# Patient Record
Sex: Female | Born: 1990
Health system: Southern US, Community
[De-identification: ages and names within clinical notes are randomized; demographics above are authoritative.]

## PROBLEM LIST (undated history)

## (undated) DIAGNOSIS — G510 Bell's palsy: Secondary | ICD-10-CM

---
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
## 2012-08-08 LAB — OB RESULTS CONSOLE HIV ANTIBODY (ROUTINE TESTING)
HIV: NONREACTIVE
HIV: NONREACTIVE

## 2012-08-08 LAB — OB RESULTS CONSOLE HEPATITIS B SURFACE ANTIGEN
Hepatitis B Surface Ag: NEGATIVE
Hepatitis B Surface Ag: NEGATIVE

## 2012-08-08 LAB — OB RESULTS CONSOLE GC/CHLAMYDIA: Chlamydia: NEGATIVE

## 2012-08-08 LAB — OB RESULTS CONSOLE RPR: RPR: NONREACTIVE

## 2012-08-08 LAB — OB RESULTS CONSOLE ABO/RH

## 2012-11-04 ENCOUNTER — Encounter (HOSPITAL_COMMUNITY): Payer: Self-pay | Admitting: Emergency Medicine

## 2012-11-04 ENCOUNTER — Emergency Department (HOSPITAL_COMMUNITY)
Admission: EM | Admit: 2012-11-04 | Discharge: 2012-11-04 | Disposition: A | Discharge status: Home / Self Care | Payer: Medicaid Other | Attending: Emergency Medicine | Admitting: Emergency Medicine

## 2012-11-04 ENCOUNTER — Emergency Department: Payer: Self-pay | Admitting: Emergency Medicine

## 2012-11-04 DIAGNOSIS — R197 Diarrhea, unspecified: Secondary | ICD-10-CM | POA: Insufficient documentation

## 2012-11-04 DIAGNOSIS — O219 Vomiting of pregnancy, unspecified: Secondary | ICD-10-CM | POA: Insufficient documentation

## 2012-11-04 DIAGNOSIS — R110 Nausea: Secondary | ICD-10-CM | POA: Insufficient documentation

## 2012-11-04 LAB — URINALYSIS, COMPLETE
Bilirubin,UR: NEGATIVE
Glucose,UR: NEGATIVE mg/dL (ref 0–75)
Nitrite: NEGATIVE
Ph: 6 (ref 4.5–8.0)
Protein: NEGATIVE
RBC,UR: NONE SEEN /HPF (ref 0–5)
Specific Gravity: 1.02 (ref 1.003–1.030)
WBC UR: 1 /HPF (ref 0–5)

## 2012-11-04 LAB — CBC WITH DIFFERENTIAL/PLATELET
Basophils Absolute: 0 10*3/uL (ref 0.0–0.1)
Eosinophils Relative: 0 % (ref 0–5)
HCT: 36.4 % (ref 36.0–46.0)
Lymphocytes Relative: 15 % (ref 12–46)
Lymphs Abs: 0.8 10*3/uL (ref 0.7–4.0)
MCV: 84.3 fL (ref 78.0–100.0)
Monocytes Absolute: 0.5 10*3/uL (ref 0.1–1.0)
Neutro Abs: 4.1 10*3/uL (ref 1.7–7.7)
RBC: 4.32 MIL/uL (ref 3.87–5.11)
RDW: 13.1 % (ref 11.5–15.5)
WBC: 5.3 10*3/uL (ref 4.0–10.5)

## 2012-11-04 LAB — COMPREHENSIVE METABOLIC PANEL
ALT: 11 U/L (ref 0–35)
AST: 18 U/L (ref 0–37)
BUN: 5 mg/dL — ABNORMAL LOW (ref 7–18)
Bilirubin,Total: 0.5 mg/dL (ref 0.2–1.0)
CO2: 19 mEq/L (ref 19–32)
Chloride: 99 mEq/L (ref 96–112)
Co2: 22 mmol/L (ref 21–32)
Creatinine, Ser: 0.44 mg/dL — ABNORMAL LOW (ref 0.50–1.10)
EGFR (African American): >60
EGFR (Non-African Amer.): >60
GFR calc Af Amer: >90 mL/min (ref >90)
GFR calc non Af Amer: >90 mL/min (ref >90)
Glucose, Bld: 79 mg/dL (ref 70–99)
Osmolality: 264 (ref 275–301)
Potassium: 3.3 mmol/L — ABNORMAL LOW (ref 3.5–5.1)
SGPT (ALT): 17 U/L (ref 12–78)
Sodium: 133 mEq/L — ABNORMAL LOW (ref 135–145)
Total Bilirubin: 0.4 mg/dL (ref 0.3–1.2)
Total Protein: 7.7 g/dL (ref 6.4–8.2)

## 2012-11-04 LAB — CBC
HCT: 38.4 % (ref 35.0–47.0)
MCHC: 34.7 g/dL (ref 32.0–36.0)
Platelet: 220 10*3/uL (ref 150–440)
WBC: 5.8 10*3/uL (ref 3.6–11.0)

## 2012-11-04 LAB — URINALYSIS, ROUTINE W REFLEX MICROSCOPIC
Glucose, UA: NEGATIVE mg/dL
Hgb urine dipstick: NEGATIVE
Ketones, ur: >80 mg/dL — AB
Protein, ur: 30 mg/dL — AB
Urobilinogen, UA: 1 mg/dL (ref 0.0–1.0)

## 2012-11-04 LAB — HCG, QUANTITATIVE, PREGNANCY: Beta Hcg, Quant.: 10459 m[IU]/mL — ABNORMAL HIGH

## 2012-11-04 LAB — LIPASE, BLOOD: Lipase: 70 U/L — ABNORMAL LOW (ref 73–393)

## 2012-11-04 MED ORDER — SODIUM CHLORIDE 0.9 % IV BOLUS (SEPSIS): 1000.0000 mL | Freq: Once | INTRAVENOUS | Status: DC

## 2012-11-04 MED ORDER — SODIUM CHLORIDE 0.9 % IV BOLUS (SEPSIS)
1000.0000 mL | Freq: Once | INTRAVENOUS | Status: AC
  Administered 2012-11-04: 1000 mL via INTRAVENOUS

## 2012-11-04 MED ORDER — POTASSIUM CHLORIDE 10 MEQ/100ML IV SOLN
10.0000 meq | Freq: Once | INTRAVENOUS | Status: AC
  Administered 2012-11-04: 10 meq via INTRAVENOUS
  Filled 2012-11-04: qty 100

## 2012-11-04 MED ORDER — POTASSIUM CHLORIDE CRYS ER 20 MEQ PO TBCR
40.0000 meq | EXTENDED_RELEASE_TABLET | Freq: Once | ORAL | Status: AC
  Administered 2012-11-04: 40 meq via ORAL
  Filled 2012-11-04: qty 2

## 2012-11-04 MED ORDER — ONDANSETRON HCL 4 MG PO TABS: 4.0000 mg | ORAL_TABLET | Freq: Four times a day (QID) | ORAL | Status: DC

## 2012-11-04 MED ORDER — ONDANSETRON HCL 4 MG/2ML IJ SOLN
4.0000 mg | Freq: Once | INTRAMUSCULAR | Status: AC
  Administered 2012-11-04: 4 mg via INTRAVENOUS
  Filled 2012-11-04: qty 2

## 2012-11-04 NOTE — ED Notes (Addendum)
Pt presenting to ed with c/o "I'm 5 months pregnant and I'm having abdominal pain with positive nausea, vomiting and diarrhea x 1 day. Pt states I haven't been able to keep anything down so I wanted to make sure that everything was ok. Pt states I'm having abdominal pressure.

## 2012-11-04 NOTE — ED Provider Notes (Signed)
History  This chart was scribed for Glynn Octave, MD by Bennett Scrape, ED Scribe. This patient was seen in room WA12/WA12 and the patient's care was started at 7:01 PM.  CSN: [id number]  Arrival date & time 11/04/12  1732   First MD Initiated Contact with Patient 11/04/12 1901      Chief Complaint  Patient presents with  . pregnant 5 months   . nausea and vomiting    The history is provided by the patient. No language interpreter was used.    Cynthia Patel is a 22 y.o. female who is 5 months pregnant who presents to the Emergency Department complaining of sudden onset, non-changing, constant non-bloody emesis with associated abdominal pressure and nausea that started last night. She reports that she call her PCP at the health department but the health department was closed. 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 She denies eating any suspect foods. She is G1P0. She reports that she has had a [place] that showed the pregnancy was in the correct postion and that the baby is growing properly. She denies having any nausea with this pregnancy until now. She reports having a normal BM today. She reports a HA earlier but denies one now. She denies vaginal bleeding or discharge, fevers, diarrhea, consitpation, abdominal pain, CP and back pain as associated symptoms. She does not have a h/o chronic medical conditions and denies smoking and alcohol use.  PCP is with [facility] and Ob[facility]GYN is with [facility].  History reviewed. No pertinent past medical history.  History reviewed. No pertinent past surgical history.  No family history on file.  History  Substance Use Topics  . Smoking status: Never Smoker   . Smokeless tobacco: Not on file  . Alcohol Use: No    OB History    Grav Para Term Preterm Abortions TAB SAB Ect Mult Living   1               Review of Systems  A complete 10  system review of systems was obtained and all systems are negative except as noted in the HPI and PMH.   Allergies  Review of patient's allergies indicates no known allergies.  Home Medications   Current Outpatient Rx  Name  Route  Sig  Dispense  Refill  . PRENATAL MULTIVITAMIN CH   Oral   Take 1 tablet by mouth daily.           Triage Vitals: BP 126/63  Pulse 114  Temp 100 F (37.8 C) (Oral)  Resp 20  SpO2 100%  LMP 05/15/2012  Physical Exam  Nursing note and vitals reviewed. Constitutional: She is oriented to person, place, and time. She appears well-developed and well-nourished. No distress.  HENT:  Head: Normocephalic and atraumatic.  Mouth/Throat: Oropharynx is clear and moist.       Moist MM  Eyes: Conjunctivae normal and EOM are normal. Pupils are equal, round, and reactive to light.  Neck: Normal range of motion. Neck supple. No tracheal deviation present.  Cardiovascular: Normal rate and regular rhythm.   Pulmonary/Chest: Effort normal and breath sounds normal. No respiratory distress.  Abdominal: Soft. There is no tenderness.       Gravid abdomen, no CVA tenderness  Musculoskeletal: Normal range of motion.  Neurological: She is alert and oriented to person, place, and time.  Skin: Skin is warm and dry.  Psychiatric: She  has a normal mood and affect. Her behavior is normal.    ED Course  Procedures (including critical care time)  DIAGNOSTIC STUDIES: Oxygen Saturation is 100% on room air, normal by my interpretation.    COORDINATION OF CARE: 7:10 PM-Discussed treatment plan which includes antiemetic, IV fluids, CXR, CBC panel, and UA with pt at bedside and pt agreed to plan.   7:15 PM- Ordered 1,000 mL of bolus and 4 mg Zofran injection  8:43 PM- Pt rechecked and states that she feels improved and is willing to try a PO challenge. Informed pt of lab work results and of what lab work is still pending.  10:25 PM- Pt rechecked and reports improvement in  symptoms. She denies nausea currently and reports that she drank water with no complications. Discussed discharge plan and pt agreed.  Labs Reviewed  COMPREHENSIVE METABOLIC PANEL - Abnormal; Notable for the following:    Sodium 133 (*)     Potassium 3.1 (*)     BUN 5 (*)     Creatinine, Ser 0.44 (*)     Albumin 3.1 (*)     All other components within normal limits  URINALYSIS, ROUTINE W REFLEX MICROSCOPIC - Abnormal; Notable for the following:    Color, Urine AMBER (*)  BIOCHEMICALS MAY BE AFFECTED BY COLOR   Specific Gravity, Urine 1.033 (*)     Bilirubin Urine SMALL (*)     Ketones, ur >80 (*)     Protein, ur 30 (*)     All other components within normal limits  PREGNANCY, URINE - Abnormal; Notable for the following:    Preg Test, Ur POSITIVE (*)     All other components within normal limits  URINE MICROSCOPIC-ADD ON - Abnormal; Notable for the following:    Casts GRANULAR CAST (*)     All other components within normal limits  CBC WITH DIFFERENTIAL  LIPASE, BLOOD   No results found.   No diagnosis found.    MDM  Patient is 5 months pregnant confirmed by OB in [place] presenting with nausea and vomiting for the past day. She denies abdominal pain, vaginal bleeding, contractions, chest pain or shortness of breath.  Patient's abdomen is gravid, soft and nontender. She is given IV fluids, antiemetics. She's had no vomiting in the ED. Pregnancy was monitored a rapid response nurse. No contractions or fetal distress noted.  Patient feels improved and is tolerating by mouth. Her heart rate is improved and she is stable for followup with her OB. She'll be given Zofran when necessary  I personally performed the services described in this documentation, which was scribed in my presence. The recorded information has been reviewed and is accurate.      Glynn Octave, MD 11/04/12 [id number]

## 2012-11-22 LAB — HM HIV SCREENING LAB: HM HIV Screening: NEGATIVE

## 2012-12-02 LAB — OB RESULTS CONSOLE RPR: RPR: NONREACTIVE

## 2013-01-29 LAB — OB RESULTS CONSOLE GBS
GBS: POSITIVE
GBS: POSITIVE

## 2013-02-21 ENCOUNTER — Observation Stay: Payer: Self-pay | Admitting: Obstetrics & Gynecology

## 2013-02-22 ENCOUNTER — Inpatient Hospital Stay (HOSPITAL_COMMUNITY)
Admission: AD | Admit: 2013-02-22 | Discharge: 2013-02-26 | DRG: 765 | Disposition: A | Discharge status: Home / Self Care | Payer: Medicaid Other | Source: Ambulatory Visit | Attending: Obstetrics & Gynecology | Admitting: Obstetrics & Gynecology

## 2013-02-22 ENCOUNTER — Encounter (HOSPITAL_COMMUNITY): Payer: Self-pay

## 2013-02-22 ENCOUNTER — Observation Stay: Payer: Self-pay | Admitting: Obstetrics & Gynecology

## 2013-02-22 DIAGNOSIS — O1002 Pre-existing essential hypertension complicating childbirth: Secondary | ICD-10-CM | POA: Diagnosis present

## 2013-02-22 DIAGNOSIS — Z22330 Carrier of Group B streptococcus: Secondary | ICD-10-CM

## 2013-02-22 DIAGNOSIS — O324XX0 Maternal care for high head at term, not applicable or unspecified: Secondary | ICD-10-CM | POA: Diagnosis present

## 2013-02-22 DIAGNOSIS — O99892 Other specified diseases and conditions complicating childbirth: Secondary | ICD-10-CM | POA: Diagnosis present

## 2013-02-22 NOTE — MAU Note (Signed)
Contractions all day have not gotten any rest.

## 2013-02-22 NOTE — MAU Provider Note (Signed)
  History     CSN: [id number]  Arrival date and time: 02/22/13 2117   None     Chief Complaint  Patient presents with  . Contractions   HPI Cynthia Patel is a 22 y.o. G1P0 female at [redacted]w[redacted]d who presents w/ report of uc's all day that have increased in frequency and intensity.  Reports good fm.  Denies lof or vb. Went to [employer] earlier today for same and was 1cm and d/c'd. PNC at [facility]. Came here b/c she didn't think she could make it to [place].  Denies any complications during pregnancy other than few elevated bp's 1st trimester and 3rd trimester. Per pt has never been dx w/ CHTN or worked up for pre-e. Denies ha, scotomata, ruq/epigastric pain, n/v.     OB History   Grav Para Term Preterm Abortions TAB SAB Ect Mult Living   1               No past medical history on file.  No past surgical history on file.  No family history on file.  History  Substance Use Topics  . Smoking status: Never Smoker   . Smokeless tobacco: Not on file  . Alcohol Use: No    Allergies:  Allergies  Allergen Reactions  . Amoxicillin Itching and Rash    Prescriptions prior to admission  Medication Sig Dispense Refill  . acetaminophen (TYLENOL) 500 MG tablet Take 500 mg by mouth every 6 (six) hours as needed for pain.      . Prenatal Vit-Fe Fumarate-FA (PRENATAL MULTIVITAMIN) TABS Take 1 tablet by mouth daily.        Review of Systems  Constitutional: Negative.   HENT: Negative.   Eyes: Negative.  Negative for blurred vision and double vision.  Respiratory: Negative.   Cardiovascular: Negative.   Gastrointestinal: Positive for abdominal pain (uc's).  Genitourinary: Negative.   Musculoskeletal: Negative.   Skin: Negative.   Neurological: Negative.  Negative for headaches.  Endo/Heme/Allergies: Negative.   Psychiatric/Behavioral: Negative.    Physical Exam   Blood pressure 140/89, pulse 95, temperature 98.1 F (36.7 C), resp. rate 18, height 5\' 6"  (1.676 m), last  menstrual period 05/15/2012.  Physical Exam  Constitutional: She is oriented to person, place, and time. She appears well-developed and well-nourished.  HENT:  Head: Normocephalic.  Neck: Normal range of motion.  Cardiovascular: Normal rate.   Respiratory: Effort normal.  GI: Soft.  gravid  Genitourinary: Vagina normal and uterus normal.  4/80/-2, vtx  Musculoskeletal: Normal range of motion.  Neurological: She is alert and oriented to person, place, and time. She has normal reflexes.  Skin: Skin is warm and dry.  Psychiatric: She has a normal mood and affect. Her behavior is normal. Judgment and thought content normal.   2240: Pt requests to walk, will recheck cx in 1hr  MAU Course  Procedures  EFM SVE  Assessment and Plan  Please refer to H&P  [name] 02/22/2013, 10:42 PM

## 2013-02-23 ENCOUNTER — Encounter (HOSPITAL_COMMUNITY): Payer: Self-pay | Admitting: Anesthesiology

## 2013-02-23 ENCOUNTER — Encounter (HOSPITAL_COMMUNITY)
Admission: AD | Disposition: A | Discharge status: Home / Self Care | Payer: Self-pay | Source: Ambulatory Visit | Attending: Obstetrics & Gynecology

## 2013-02-23 ENCOUNTER — Encounter (HOSPITAL_COMMUNITY): Payer: Self-pay | Admitting: *Deleted

## 2013-02-23 ENCOUNTER — Inpatient Hospital Stay (HOSPITAL_COMMUNITY): Payer: Medicaid Other | Admitting: Anesthesiology

## 2013-02-23 DIAGNOSIS — O1002 Pre-existing essential hypertension complicating childbirth: Secondary | ICD-10-CM

## 2013-02-23 DIAGNOSIS — O324XX0 Maternal care for high head at term, not applicable or unspecified: Secondary | ICD-10-CM

## 2013-02-23 LAB — TYPE AND SCREEN
ABO/RH(D): O POS
Antibody Screen: NEGATIVE

## 2013-02-23 LAB — PROTEIN / CREATININE RATIO, URINE
Protein Creatinine Ratio: 0.25 — ABNORMAL HIGH (ref 0.00–0.15)
Total Protein, Urine: 62.2 mg/dL

## 2013-02-23 LAB — CBC
Hemoglobin: 11.7 g/dL — ABNORMAL LOW (ref 12.0–15.0)
MCH: 26.5 pg (ref 26.0–34.0)
Platelets: 260 10*3/uL (ref 150–400)
RBC: 4.42 MIL/uL (ref 3.87–5.11)

## 2013-02-23 LAB — RPR: RPR Ser Ql: NONREACTIVE

## 2013-02-23 LAB — COMPREHENSIVE METABOLIC PANEL
AST: 14 U/L (ref 0–37)
CO2: 20 mEq/L (ref 19–32)
Calcium: 9.2 mg/dL (ref 8.4–10.5)
Creatinine, Ser: 0.46 mg/dL — ABNORMAL LOW (ref 0.50–1.10)
GFR calc Af Amer: >90 mL/min (ref >90)
GFR calc non Af Amer: >90 mL/min (ref >90)
Glucose, Bld: 96 mg/dL (ref 70–99)
Total Protein: 6.8 g/dL (ref 6.0–8.3)

## 2013-02-23 SURGERY — Surgical Case
Anesthesia: Epidural | Site: Abdomen | Wound class: Clean Contaminated

## 2013-02-23 MED ORDER — OXYTOCIN 40 UNITS IN LACTATED RINGERS INFUSION - SIMPLE MED: 62.5000 mL/h | INTRAVENOUS | Status: AC

## 2013-02-23 MED ORDER — OXYTOCIN 10 UNIT/ML IJ SOLN
INTRAMUSCULAR | Status: AC
  Filled 2013-02-23: qty 2

## 2013-02-23 MED ORDER — FENTANYL CITRATE 0.05 MG/ML IJ SOLN
100.0000 ug | INTRAMUSCULAR | Status: DC | PRN
  Administered 2013-02-23: 100 ug via INTRAVENOUS
  Filled 2013-02-23: qty 2

## 2013-02-23 MED ORDER — METOCLOPRAMIDE HCL 5 MG/ML IJ SOLN: 10.0000 mg | Freq: Three times a day (TID) | INTRAMUSCULAR | Status: DC | PRN

## 2013-02-23 MED ORDER — KETOROLAC TROMETHAMINE 30 MG/ML IJ SOLN
INTRAMUSCULAR | Status: AC
  Filled 2013-02-23: qty 1

## 2013-02-23 MED ORDER — LANOLIN HYDROUS EX OINT: 1.0000 "application " | TOPICAL_OINTMENT | CUTANEOUS | Status: DC | PRN

## 2013-02-23 MED ORDER — OXYTOCIN BOLUS FROM INFUSION: 500.0000 mL | INTRAVENOUS | Status: DC

## 2013-02-23 MED ORDER — ONDANSETRON HCL 4 MG/2ML IJ SOLN: 4.0000 mg | Freq: Three times a day (TID) | INTRAMUSCULAR | Status: DC | PRN

## 2013-02-23 MED ORDER — ONDANSETRON HCL 4 MG/2ML IJ SOLN: 4.0000 mg | Freq: Four times a day (QID) | INTRAMUSCULAR | Status: DC | PRN

## 2013-02-23 MED ORDER — LACTATED RINGERS IV SOLN
INTRAVENOUS | Status: DC
  Administered 2013-02-23 (×4): via INTRAVENOUS

## 2013-02-23 MED ORDER — FENTANYL CITRATE 0.05 MG/ML IJ SOLN
INTRAMUSCULAR | Status: AC
  Filled 2013-02-23: qty 2

## 2013-02-23 MED ORDER — FENTANYL CITRATE 0.05 MG/ML IJ SOLN
25.0000 ug | INTRAMUSCULAR | Status: DC | PRN
  Administered 2013-02-23: 50 ug via INTRAVENOUS

## 2013-02-23 MED ORDER — IBUPROFEN 600 MG PO TABS
600.0000 mg | ORAL_TABLET | Freq: Four times a day (QID) | ORAL | Status: DC
  Administered 2013-02-24 – 2013-02-26 (×11): 600 mg via ORAL
  Filled 2013-02-23 (×12): qty 1

## 2013-02-23 MED ORDER — OXYTOCIN 40 UNITS IN LACTATED RINGERS INFUSION - SIMPLE MED
1.0000 m[IU]/min | INTRAVENOUS | Status: DC
  Administered 2013-02-23: 2 m[IU]/min via INTRAVENOUS

## 2013-02-23 MED ORDER — MENTHOL 3 MG MT LOZG: 1.0000 | LOZENGE | OROMUCOSAL | Status: DC | PRN

## 2013-02-23 MED ORDER — PHENYLEPHRINE 40 MCG/ML (10ML) SYRINGE FOR IV PUSH (FOR BLOOD PRESSURE SUPPORT)
80.0000 ug | PREFILLED_SYRINGE | INTRAVENOUS | Status: DC | PRN
  Filled 2013-02-23: qty 5

## 2013-02-23 MED ORDER — DIPHENHYDRAMINE HCL 50 MG/ML IJ SOLN: 12.5000 mg | INTRAMUSCULAR | Status: DC | PRN

## 2013-02-23 MED ORDER — ONDANSETRON HCL 4 MG/2ML IJ SOLN
INTRAMUSCULAR | Status: AC
  Filled 2013-02-23: qty 2

## 2013-02-23 MED ORDER — KETOROLAC TROMETHAMINE 30 MG/ML IJ SOLN: 30.0000 mg | Freq: Four times a day (QID) | INTRAMUSCULAR | Status: AC | PRN

## 2013-02-23 MED ORDER — ACETAMINOPHEN 325 MG PO TABS: 650.0000 mg | ORAL_TABLET | ORAL | Status: DC | PRN

## 2013-02-23 MED ORDER — CLINDAMYCIN PHOSPHATE 900 MG/50ML IV SOLN
900.0000 mg | Freq: Three times a day (TID) | INTRAVENOUS | Status: DC
  Administered 2013-02-23 (×2): 900 mg via INTRAVENOUS
  Filled 2013-02-23 (×4): qty 50

## 2013-02-23 MED ORDER — DIPHENHYDRAMINE HCL 25 MG PO CAPS
25.0000 mg | ORAL_CAPSULE | ORAL | Status: DC | PRN
  Filled 2013-02-23: qty 1

## 2013-02-23 MED ORDER — SCOPOLAMINE 1 MG/3DAYS TD PT72
1.0000 | MEDICATED_PATCH | Freq: Once | TRANSDERMAL | Status: DC
  Administered 2013-02-23: 1.5 mg via TRANSDERMAL

## 2013-02-23 MED ORDER — LACTATED RINGERS IV SOLN
INTRAVENOUS | Status: DC
  Administered 2013-02-23: 22:00:00 via INTRAVENOUS

## 2013-02-23 MED ORDER — NALOXONE HCL 0.4 MG/ML IJ SOLN: 0.4000 mg | INTRAMUSCULAR | Status: DC | PRN

## 2013-02-23 MED ORDER — WITCH HAZEL-GLYCERIN EX PADS: 1.0000 "application " | MEDICATED_PAD | CUTANEOUS | Status: DC | PRN

## 2013-02-23 MED ORDER — SENNOSIDES-DOCUSATE SODIUM 8.6-50 MG PO TABS
2.0000 | ORAL_TABLET | Freq: Every day | ORAL | Status: DC
  Administered 2013-02-24 – 2013-02-25 (×2): 2 via ORAL

## 2013-02-23 MED ORDER — MEASLES, MUMPS & RUBELLA VAC ~~LOC~~ INJ
0.5000 mL | INJECTION | Freq: Once | SUBCUTANEOUS | Status: DC
  Filled 2013-02-23: qty 0.5

## 2013-02-23 MED ORDER — MORPHINE SULFATE (PF) 0.5 MG/ML IJ SOLN
INTRAMUSCULAR | Status: DC | PRN
  Administered 2013-02-23: 4 mg via EPIDURAL

## 2013-02-23 MED ORDER — KETOROLAC TROMETHAMINE 30 MG/ML IJ SOLN
30.0000 mg | Freq: Four times a day (QID) | INTRAMUSCULAR | Status: AC | PRN
  Administered 2013-02-23: 30 mg via INTRAVENOUS

## 2013-02-23 MED ORDER — FERROUS SULFATE 325 (65 FE) MG PO TABS
325.0000 mg | ORAL_TABLET | Freq: Two times a day (BID) | ORAL | Status: DC
  Administered 2013-02-24 – 2013-02-26 (×5): 325 mg via ORAL
  Filled 2013-02-23 (×5): qty 1

## 2013-02-23 MED ORDER — ONDANSETRON HCL 4 MG/2ML IJ SOLN
INTRAMUSCULAR | Status: DC | PRN
  Administered 2013-02-23: 4 mg via INTRAVENOUS

## 2013-02-23 MED ORDER — BISACODYL 10 MG RE SUPP: 10.0000 mg | Freq: Every day | RECTAL | Status: DC | PRN

## 2013-02-23 MED ORDER — OXYTOCIN 10 UNIT/ML IJ SOLN
40.0000 [IU] | INTRAVENOUS | Status: DC | PRN
  Administered 2013-02-23: 40 [IU] via INTRAVENOUS

## 2013-02-23 MED ORDER — OXYCODONE-ACETAMINOPHEN 5-325 MG PO TABS
1.0000 | ORAL_TABLET | ORAL | Status: DC | PRN
  Administered 2013-02-24 – 2013-02-26 (×3): 1 via ORAL
  Filled 2013-02-23 (×3): qty 1

## 2013-02-23 MED ORDER — IBUPROFEN 600 MG PO TABS: 600.0000 mg | ORAL_TABLET | Freq: Four times a day (QID) | ORAL | Status: DC | PRN

## 2013-02-23 MED ORDER — SIMETHICONE 80 MG PO CHEW
80.0000 mg | CHEWABLE_TABLET | Freq: Three times a day (TID) | ORAL | Status: DC
  Administered 2013-02-23 – 2013-02-26 (×9): 80 mg via ORAL

## 2013-02-23 MED ORDER — DIPHENHYDRAMINE HCL 25 MG PO CAPS
25.0000 mg | ORAL_CAPSULE | Freq: Four times a day (QID) | ORAL | Status: DC | PRN
  Administered 2013-02-24: 25 mg via ORAL

## 2013-02-23 MED ORDER — SCOPOLAMINE 1 MG/3DAYS TD PT72
MEDICATED_PATCH | TRANSDERMAL | Status: AC
  Filled 2013-02-23: qty 1

## 2013-02-23 MED ORDER — BUPIVACAINE HCL (PF) 0.25 % IJ SOLN
INTRAMUSCULAR | Status: AC
  Filled 2013-02-23: qty 30

## 2013-02-23 MED ORDER — LACTATED RINGERS IV SOLN: 500.0000 mL | INTRAVENOUS | Status: DC | PRN

## 2013-02-23 MED ORDER — OXYCODONE-ACETAMINOPHEN 5-325 MG PO TABS: 1.0000 | ORAL_TABLET | ORAL | Status: DC | PRN

## 2013-02-23 MED ORDER — SODIUM CHLORIDE 0.9 % IR SOLN
Status: DC | PRN
  Administered 2013-02-23: 1000 mL

## 2013-02-23 MED ORDER — MORPHINE SULFATE 0.5 MG/ML IJ SOLN
INTRAMUSCULAR | Status: AC
  Filled 2013-02-23: qty 10

## 2013-02-23 MED ORDER — LIDOCAINE HCL (PF) 1 % IJ SOLN
30.0000 mL | INTRAMUSCULAR | Status: DC | PRN
  Filled 2013-02-23: qty 30

## 2013-02-23 MED ORDER — MEPERIDINE HCL 25 MG/ML IJ SOLN
6.2500 mg | INTRAMUSCULAR | Status: DC | PRN
  Administered 2013-02-23: 6.25 mg via INTRAVENOUS

## 2013-02-23 MED ORDER — NALBUPHINE HCL 10 MG/ML IJ SOLN
5.0000 mg | INTRAMUSCULAR | Status: DC | PRN
  Filled 2013-02-23: qty 1

## 2013-02-23 MED ORDER — SODIUM BICARBONATE 8.4 % IV SOLN
INTRAVENOUS | Status: AC
  Filled 2013-02-23: qty 50

## 2013-02-23 MED ORDER — LACTATED RINGERS IV SOLN: 500.0000 mL | Freq: Once | INTRAVENOUS | Status: DC

## 2013-02-23 MED ORDER — ZOLPIDEM TARTRATE 5 MG PO TABS: 5.0000 mg | ORAL_TABLET | Freq: Every evening | ORAL | Status: DC | PRN

## 2013-02-23 MED ORDER — CITRIC ACID-SODIUM CITRATE 334-500 MG/5ML PO SOLN
30.0000 mL | ORAL | Status: DC | PRN
  Administered 2013-02-23: 30 mL via ORAL
  Filled 2013-02-23: qty 15

## 2013-02-23 MED ORDER — SODIUM BICARBONATE 8.4 % IV SOLN
INTRAVENOUS | Status: DC | PRN
  Administered 2013-02-23: 5 mL via EPIDURAL

## 2013-02-23 MED ORDER — LIDOCAINE HCL (PF) 1 % IJ SOLN
INTRAMUSCULAR | Status: DC | PRN
  Administered 2013-02-23 (×4): 4 mL

## 2013-02-23 MED ORDER — SIMETHICONE 80 MG PO CHEW: 80.0000 mg | CHEWABLE_TABLET | ORAL | Status: DC | PRN

## 2013-02-23 MED ORDER — DIBUCAINE 1 % RE OINT: 1.0000 "application " | TOPICAL_OINTMENT | RECTAL | Status: DC | PRN

## 2013-02-23 MED ORDER — DIPHENHYDRAMINE HCL 50 MG/ML IJ SOLN: 25.0000 mg | INTRAMUSCULAR | Status: DC | PRN

## 2013-02-23 MED ORDER — ONDANSETRON HCL 4 MG PO TABS: 4.0000 mg | ORAL_TABLET | ORAL | Status: DC | PRN

## 2013-02-23 MED ORDER — SODIUM CHLORIDE 0.9 % IJ SOLN: 3.0000 mL | INTRAMUSCULAR | Status: DC | PRN

## 2013-02-23 MED ORDER — FENTANYL CITRATE 0.05 MG/ML IJ SOLN
INTRAMUSCULAR | Status: DC | PRN
  Administered 2013-02-23: 100 ug via EPIDURAL

## 2013-02-23 MED ORDER — OXYTOCIN 40 UNITS IN LACTATED RINGERS INFUSION - SIMPLE MED
62.5000 mL/h | INTRAVENOUS | Status: DC
  Filled 2013-02-23: qty 1000

## 2013-02-23 MED ORDER — FENTANYL 2.5 MCG/ML BUPIVACAINE 1/10 % EPIDURAL INFUSION (WH - ANES)
14.0000 mL/h | INTRAMUSCULAR | Status: DC | PRN
  Administered 2013-02-23 (×2): 14 mL/h via EPIDURAL
  Filled 2013-02-23 (×2): qty 125

## 2013-02-23 MED ORDER — EPHEDRINE 5 MG/ML INJ
10.0000 mg | INTRAVENOUS | Status: DC | PRN
  Filled 2013-02-23: qty 4

## 2013-02-23 MED ORDER — TERBUTALINE SULFATE 1 MG/ML IJ SOLN: 0.2500 mg | Freq: Once | INTRAMUSCULAR | Status: DC | PRN

## 2013-02-23 MED ORDER — EPHEDRINE 5 MG/ML INJ
10.0000 mg | INTRAVENOUS | Status: DC | PRN
  Administered 2013-02-23: 10 mg via INTRAVENOUS

## 2013-02-23 MED ORDER — FLEET ENEMA 7-19 GM/118ML RE ENEM: 1.0000 | ENEMA | Freq: Every day | RECTAL | Status: DC | PRN

## 2013-02-23 MED ORDER — TETANUS-DIPHTH-ACELL PERTUSSIS 5-2.5-18.5 LF-MCG/0.5 IM SUSP: 0.5000 mL | Freq: Once | INTRAMUSCULAR | Status: DC

## 2013-02-23 MED ORDER — LIDOCAINE-EPINEPHRINE (PF) 2 %-1:200000 IJ SOLN
INTRAMUSCULAR | Status: AC
  Filled 2013-02-23: qty 20

## 2013-02-23 MED ORDER — NALOXONE HCL 1 MG/ML IJ SOLN: 1.0000 ug/kg/h | INTRAVENOUS | Status: DC | PRN

## 2013-02-23 MED ORDER — MEPERIDINE HCL 25 MG/ML IJ SOLN
INTRAMUSCULAR | Status: AC
  Filled 2013-02-23: qty 1

## 2013-02-23 MED ORDER — BUPIVACAINE HCL (PF) 0.25 % IJ SOLN
INTRAMUSCULAR | Status: DC | PRN
  Administered 2013-02-23: 30 mL

## 2013-02-23 MED ORDER — PRENATAL MULTIVITAMIN CH
1.0000 | ORAL_TABLET | Freq: Every day | ORAL | Status: DC
  Administered 2013-02-24 – 2013-02-26 (×3): 1 via ORAL
  Filled 2013-02-23 (×3): qty 1

## 2013-02-23 MED ORDER — ONDANSETRON HCL 4 MG/2ML IJ SOLN: 4.0000 mg | INTRAMUSCULAR | Status: DC | PRN

## 2013-02-23 MED ORDER — PHENYLEPHRINE 40 MCG/ML (10ML) SYRINGE FOR IV PUSH (FOR BLOOD PRESSURE SUPPORT)
80.0000 ug | PREFILLED_SYRINGE | INTRAVENOUS | Status: DC | PRN
  Administered 2013-02-23 (×2): 80 ug via INTRAVENOUS

## 2013-02-23 MED ORDER — FLEET ENEMA 7-19 GM/118ML RE ENEM: 1.0000 | ENEMA | RECTAL | Status: DC | PRN

## 2013-02-23 SURGICAL SUPPLY — 31 items
CLOTH BEACON ORANGE TIMEOUT ST (SAFETY) ×2 IMPLANT
DRAPE LG THREE QUARTER DISP (DRAPES) ×2 IMPLANT
DRSG OPSITE POSTOP 4X10 (GAUZE/BANDAGES/DRESSINGS) ×2 IMPLANT
DURAPREP 26ML APPLICATOR (WOUND CARE) ×2 IMPLANT
ELECT REM PT RETURN 9FT ADLT (ELECTROSURGICAL) ×2
ELECTRODE REM PT RTRN 9FT ADLT (ELECTROSURGICAL) ×1 IMPLANT
EXTRACTOR VACUUM M CUP 4 TUBE (SUCTIONS) IMPLANT
GLOVE BIOGEL PI IND STRL 7.0 (GLOVE) ×2 IMPLANT
GLOVE BIOGEL PI INDICATOR 7.0 (GLOVE) ×2
GLOVE ECLIPSE 7.0 STRL STRAW (GLOVE) ×2 IMPLANT
GOWN PREVENTION PLUS XLARGE (GOWN DISPOSABLE) ×2 IMPLANT
GOWN STRL REIN XL XLG (GOWN DISPOSABLE) ×6 IMPLANT
KIT ABG SYR 3ML LUER SLIP (SYRINGE) ×2 IMPLANT
NEEDLE HYPO 22GX1.5 SAFETY (NEEDLE) ×2 IMPLANT
NEEDLE HYPO 25X5/8 SAFETYGLIDE (NEEDLE) ×2 IMPLANT
NS IRRIG 1000ML POUR BTL (IV SOLUTION) ×2 IMPLANT
PACK C SECTION WH (CUSTOM PROCEDURE TRAY) ×2 IMPLANT
PAD ABD 7.5X8 STRL (GAUZE/BANDAGES/DRESSINGS) ×2 IMPLANT
PAD OB MATERNITY 4.3X12.25 (PERSONAL CARE ITEMS) ×2 IMPLANT
RTRCTR C-SECT PINK 25CM LRG (MISCELLANEOUS) ×2 IMPLANT
STAPLER VISISTAT 35W (STAPLE) IMPLANT
STRIP CLOSURE SKIN 1/2X4 (GAUZE/BANDAGES/DRESSINGS) ×2 IMPLANT
SUT PLAIN 2 0 XLH (SUTURE) ×2 IMPLANT
SUT VIC AB 0 CTX 36 (SUTURE) ×3
SUT VIC AB 0 CTX36XBRD ANBCTRL (SUTURE) ×3 IMPLANT
SUT VIC AB 4-0 KS 27 (SUTURE) ×2 IMPLANT
SYR 30ML LL (SYRINGE) ×2 IMPLANT
TAPE CLOTH SURG 4X10 WHT LF (GAUZE/BANDAGES/DRESSINGS) ×2 IMPLANT
TOWEL OR 17X24 6PK STRL BLUE (TOWEL DISPOSABLE) ×6 IMPLANT
TRAY FOLEY CATH 14FR (SET/KITS/TRAYS/PACK) IMPLANT
WATER STERILE IRR 1000ML POUR (IV SOLUTION) ×2 IMPLANT

## 2013-02-23 NOTE — H&P (Signed)
Cynthia Patel is a 22 y.o. G1P0 female at [redacted]w[redacted]d who presents w/ report of uc's all day that have increased in frequency and intensity. Reports good fm. Denies lof or vb. Went to [employer] earlier today for same and was 1cm and d/c'd. PNC at [facility]. Came here b/c she didn't think she could make it to [place]. Denies any complications during pregnancy other than few elevated bp's 1st trimester and 3rd trimester.  Per pt has never been dx w/ CHTN or worked up for pre-e. Denies ha, scotomata, ruq/epigastric pain, n/v.    Maternal Medical History:  Reason for admission: Contractions.   Contractions: Onset was 13-24 hours ago.   Frequency: regular.   Perceived severity is strong.    Fetal activity: Perceived fetal activity is normal.   Last perceived fetal movement was within the past hour.    Prenatal complications: no prenatal complications Prenatal Complications - Diabetes: none.    OB History   Grav Para Term Preterm Abortions TAB SAB Ect Mult Living   1              History reviewed. No pertinent past medical history. History reviewed. No pertinent past surgical history. Family History: family history is not on file. Social History:  reports that she has never smoked. She does not have any smokeless tobacco history on file. She reports that she does not drink alcohol or use illicit drugs.  Review of Systems  Constitutional: Negative.   HENT: Negative.   Eyes: Negative.  Negative for blurred vision and double vision.  Respiratory: Negative.   Cardiovascular: Negative.   Gastrointestinal: Positive for abdominal pain (uc's).  Genitourinary: Negative.   Musculoskeletal: Negative.   Skin: Negative.   Neurological: Negative.  Negative for headaches.  Endo/Heme/Allergies: Negative.   Psychiatric/Behavioral: Negative.     Dilation: 5 Effacement (%): 90 Station: -2 Exam by:: Rudi Coco RN Blood pressure 140/89, pulse 95, temperature 98.1 F (36.7 C), resp. rate 18, height 5'  6" (1.676 m), weight 127.007 kg (280 lb), last menstrual period 05/15/2012. Maternal Exam:  Uterine Assessment: Contraction strength is moderate.  Contraction frequency is regular.   Abdomen: Patient reports no abdominal tenderness. Fetal presentation: vertex  Introitus: Normal vulva. Normal vagina.  Pelvis: adequate for delivery.   Cervix: Cervix evaluated by digital exam.     Physical Exam  Constitutional: She is oriented to person, place, and time. She appears well-developed and well-nourished.  HENT:  Head: Normocephalic.  Neck: Normal range of motion.  Cardiovascular: Normal rate and regular rhythm.   Respiratory: Effort normal and breath sounds normal.  GI: Soft.  gravid  Genitourinary: Vagina normal and uterus normal.  Initial sve 4/80/-2, repeat sve >1hr later by RN 5/90/-2  Musculoskeletal: Normal range of motion. She exhibits edema (1-2+ BLE edema).  Neurological: She is alert and oriented to person, place, and time. She has normal reflexes.  No clonus  Skin: Skin is warm and dry.  Psychiatric: She has a normal mood and affect. Her behavior is normal. Judgment and thought content normal.    FHR: 135, mod variability, 15x15accels, no decels= Cat  UCs: initially q 2-7, now q 3-4  Prenatal labs: ABO, Rh:   Antibody:   Rubella:   RPR:    HBsAg:    HIV:    GBS:   pos per pt  Assessment/Plan: A:  [redacted]w[redacted]d SIUP  Active labor   GBS pos  Cat I FHR  ?CHTN   P:  Admit to BS  IV pain meds/epidural prn  Cleocin 900mg  IV q 8hr per protocol for gbs pos, all to amoxicillin  Expectant managment   Anticipate NSVD  Will get pre-e labs  Obtain prenatal records from [place]  Cynthia Patel 02/23/2013, 12:52 AM

## 2013-02-23 NOTE — Progress Notes (Signed)
Cynthia Patel is a 22 y.o. G1P0 at [redacted]w[redacted]d admitted for active labor Called to room by RN for prolonged decel after epidural placement  Subjective: Just received epidural, getting comfortable  Objective: BP 140/97  Pulse 87  Temp(Src) 98.1 F (36.7 C)  Resp 18  Ht 5\' 6"  (1.676 m)  Wt 127.007 kg (280 lb)  BMI 45.21 kg/m2  SpO2 99%  LMP 05/15/2012      FHT:  125 w/ moderate variability immediately after epidural, then 8 minute prolonged decel that resolved w/ maternal position changes, fluid bolus, and phenylephrine by dr. Rodman Pickle UC:   regular, every 2-4 minutes SVE:   9/90/0 by cagna, rn during decel  Labs: Lab Results  Component Value Date   WBC 11.3* 02/23/2013   HGB 11.7* 02/23/2013   HCT 34.7* 02/23/2013   MCV 78.5 02/23/2013   PLT 260 02/23/2013    Assessment / Plan: Spontaneous labor, progressing normally  Labor: Progressing normally Preeclampsia:  labs normal, awaiting p/c ratio Fetal Wellbeing:  Category II Pain Control:  Epidural I/D:  cleocin per protocol for gbs pos Anticipated MOD:  NSVD  [name] 02/23/2013, 2:32 AM

## 2013-02-23 NOTE — Op Note (Signed)
Cynthia Patel  PROCEDURE DATE: 02/22/2013 - 02/23/2013  PREOPERATIVE DIAGNOSIS: Intrauterine pregnancy at  [redacted]w[redacted]d weeks gestation; failure to progress: arrest of descent and fetal intolerance to labor  POSTOPERATIVE DIAGNOSIS: The same  PROCEDURE: Primary/Repeat Low Transverse Cesarean Section  SURGEON:  Dr. Eber Jones L. Harraway-Smith  ASSISTANT:  none   INDICATIONS: Cynthia Patel is a 22 y.o. G1P0 at [redacted]w[redacted]d here for cesarean section secondary to the indications listed under preoperative diagnosis; please see preoperative note for further details.  The risks of cesarean section were discussed with the patient including but were not limited to: bleeding which may require transfusion or reoperation; infection which may require antibiotics; injury to bowel, bladder, ureters or other surrounding organs; injury to the fetus; need for additional procedures including hysterectomy in the event of a life-threatening hemorrhage; placental abnormalities wth subsequent pregnancies, incisional problems, thromboembolic phenomenon and other postoperative/anesthesia complications.   The patient concurred with the proposed plan, giving informed written consent for the procedure.    FINDINGS:  Viable female infant in cephalic presentation.  Apgars 8 and 9.  Clear amniotic fluid.  Intact placenta, three vessel cord.  Normal uterus, fallopian tubes and ovaries bilaterally.  ANESTHESIA: Epidural INTRAVENOUS FLUIDS: 2000 ml ESTIMATED BLOOD LOSS: 650 ml URINE OUTPUT:  50 ml SPECIMENS: Placenta sent to L&D COMPLICATIONS: None immediate  PROCEDURE IN DETAIL:  The patient preoperatively received intravenous antibiotics and had sequential compression devices applied to her lower extremities.  She was then taken to the operating room where epidural was dosed up to level and was found to be adequate. She was then placed in a dorsal supine position with a leftward tilt, and prepped and draped in a sterile manner.  A foley  catheter was in place in her bladder and attached to constant gravity; approximately 50 cc of pink/tea colored fluid were in the catheter at start of surgery.  After an adequate timeout was performed, a Pfannenstiel skin incision was made with scalpel and carried through to the underlying layer of fascia. The fascia was incised in the midline, and this incision was extended bilaterally with blunt dissection. The rectus muscles were separated in the midline bluntly and the peritoneum was entered bluntly. 30 ml of Nesacaine was administered topically in the peritoneal cavity due to patient complaint of pain. Attention was turned to the lower uterine segment where a low transverse hysterotomy incision was made with a scalpel and extended bilaterally bluntly.  The infant was successfully delivered, the cord was clamped and cut and the infant was handed over to awaiting neonatology team. Blood gas and cord gas samples were obtained. Uterine massage was then administered, and the placenta delivered intact with a three-vessel cord. The uterus was then cleared of clot and debris.  The hysterotomy was closed with 0 Vicryl in a running locked fashion, and an imbricating layer was also placed with the same suture. The pelvis was cleared of all clot and debris. Hemostasis was confirmed on all surfaces.  The peritoneum and the muscles were reapproximated using 0 Vicryl in a pursestring suture. The fascia was then closed using 0 Vicryl in a running fashion.  The subcutaneous layer was irrigated, then reapproximated with 0 plain suture in interrupted stitches. 20 ml of 0.25% Marcaine was injected subcutaneously around the incision. The skin was closed with a 4-0 Vicryl subcuticular stitch. The foley catheter drained tea-colored fluid throughout the surgery. The patient tolerated the procedure well. Sponge, lap, instrument and needle counts were correct x 2.  She was taken  to the recovery room in stable condition.   Napoleon Form,  MD 02/23/2013 3:44 PM

## 2013-02-23 NOTE — Op Note (Signed)
I was present and scrubbed in for entirety of procedure.

## 2013-02-23 NOTE — OR Nursing (Signed)
Foley catheter in upon arrival to OR. Urine color-blood tinged. 

## 2013-02-23 NOTE — Anesthesia Procedure Notes (Signed)
Epidural Patient location during procedure: OB Start time: 02/23/2013 2:04 AM  Staffing Performed by: anesthesiologist   Preanesthetic Checklist Completed: patient identified, site marked, surgical consent, pre-op evaluation, timeout performed, IV checked, risks and benefits discussed and monitors and equipment checked  Epidural Patient position: sitting Prep: site prepped and draped and DuraPrep Patient monitoring: continuous pulse ox and blood pressure Approach: midline Injection technique: LOR air  Needle:  Needle type: Tuohy  Needle gauge: 17 G Needle length: 9 cm and 9 Needle insertion depth: 8 cm Catheter type: closed end flexible Catheter size: 19 Gauge Catheter at skin depth: 13 cm Test dose: negative  Assessment Events: blood not aspirated, injection not painful, no injection resistance, negative IV test and no paresthesia  Additional Notes Discussed risk of headache, infection, bleeding, nerve injury and failed or incomplete block.  Patient voices understanding and wishes to proceed.  Epidural placed on first attempt.  No paresthesia.  Patient tolerated procedure well with no apparent complications.  Jasmine December, MDReason for block:procedure for pain

## 2013-02-23 NOTE — OR Nursing (Addendum)
Uterus massaged by S. Peggyann Zwiefelhofer RN.  Two tubes of cord blood sent to lab. 40cc of blood evacuated from uterus during uterine massage. 

## 2013-02-23 NOTE — Progress Notes (Addendum)
Cynthia Patel is a 22 y.o. G1P0 at [redacted]w[redacted]d admitted for active labor  Subjective: Comfortable w/ epidural, feeling pelvic/rectal pressure Denies ha, scotomata, ruq/epigastric pain, n/v.    Objective: BP 120/61  Pulse 87  Temp(Src) 98.5 F (36.9 C) (Oral)  Resp 18  Ht 5\' 6"  (1.676 m)  Wt 127.007 kg (280 lb)  BMI 45.21 kg/m2  SpO2 100%  LMP 05/15/2012 I/O last 3 completed shifts: In: -  Out: 250 [Urine:250]    FHT:  FHR: 135 bpm, variability: moderate,  accelerations:  Present,  decelerations:  Present earlies UC:   regular, every 2-4 minutes SVE:   Lt rim of cx from 12-3 o'clock/90/0 SROM clear fluid @ 0512   Labs: Lab Results  Component Value Date   WBC 11.3* 02/23/2013   HGB 11.7* 02/23/2013   HCT 34.7* 02/23/2013   MCV 78.5 02/23/2013   PLT 260 02/23/2013    Assessment / Plan: Initial rapid cervical change, now has slowed.  Rim x 2+ hours.  Pt turned to Lt exaggerated sims, RN to empty bladder, will initiate pitocin per protocol   Labor: slow progression Preeclampsia:  labs stable, p/c ratio still pending Fetal Wellbeing:  Category I Pain Control:  Epidural I/D:  Cleocin for GBS pos Anticipated MOD:  NSVD  Prenatal records from [place] now in chart.  Early 1hr gtt 80, repeat 82- all labs normal.  PIH panel done 3/24- normal. Few bp's 130s/70s early preg, otherwise normal.  H/O PID [id number]  [name] 02/23/2013, 7:30 AM

## 2013-02-23 NOTE — Transfer of Care (Signed)
Immediate Anesthesia Transfer of Care Note  Patient: Cynthia Patel  Procedure(s) Performed: Procedure(s): Primary cesarean section with delivery of baby boy at 70. Apgars 8/9. (N/A)  Patient Location: PACU  Anesthesia Type:Epidural  Level of Consciousness: awake, alert , oriented and patient cooperative  Airway & Oxygen Therapy: Patient Spontanous Breathing  Post-op Assessment: Report given to PACU RN and Post -op Vital signs reviewed and stable  Post vital signs: Reviewed and stable  Complications: No apparent anesthesia complications

## 2013-02-23 NOTE — Progress Notes (Signed)
Patient ID: Cynthia Patel, female   DOB: 02/14/1991, 22 y.o.   MRN: [id number]  S:  Called to patient room for decel to 60. Patient comfortable.   O:   Filed Vitals:   02/23/13 1411  BP: 126/71  Pulse: 91  Temp:   Resp:     Cervix:  Complete/+1 station  FHTs:  135, mod var, no accels, variable and late decels, increasingly deep, last to 60s without pushing. Attempted repositioning, attempted to restart pitocin.  A/P 22 y.o. G1P0 at [redacted]w[redacted]d with  - Arrest of descent - Fetal intolerance to labor  Patient advised of recommendation for cesarean section for the above reasons.  The risks of cesarean section were discussed with the patient including but were not limited to: bleeding which may require transfusion or reoperation; infection which may require antibiotics; injury to bowel, bladder, ureters or other surrounding organs; injury to the fetus; need for additional procedures including hysterectomy in the event of a life-threatening hemorrhage; placental abnormalities wth subsequent pregnancies, incisional problems, thromboembolic phenomenon and other postoperative/anesthesia complications.  The patient concurred with the proposed plan, giving informed written consent for the procedures.  Patient has been NPO since 7pm yesterday she will remain NPO for procedure. Anesthesia and OR aware.  Preoperative prophylactic antibiotics and SCDs ordered on call to the OR.  To OR when ready.  Napoleon Form, MD

## 2013-02-23 NOTE — Anesthesia Preprocedure Evaluation (Addendum)
Anesthesia Evaluation  Patient identified by MRN, date of birth, ID band Patient awake    Reviewed: Allergy & Precautions, H&P , NPO status , Patient's Chart, lab work & pertinent test results, reviewed documented beta blocker date and time   History of Anesthesia Complications Negative for: history of anesthetic complications  Airway Mallampati: III TM Distance: >3 FB Neck ROM: full    Dental  (+) Teeth Intact   Pulmonary neg pulmonary ROS,  breath sounds clear to auscultation        Cardiovascular negative cardio ROS  Rhythm:regular Rate:Normal     Neuro/Psych negative neurological ROS  negative psych ROS   GI/Hepatic negative GI ROS, Neg liver ROS,   Endo/Other  Morbid obesity  Renal/GU negative Renal ROS  negative genitourinary   Musculoskeletal   Abdominal   Peds  Hematology negative hematology ROS (+)   Anesthesia Other Findings Tongue piercing - asked to remove  Reproductive/Obstetrics (+) Pregnancy                           Anesthesia Physical Anesthesia Plan  ASA: III and emergent  Anesthesia Plan: Epidural   Post-op Pain Management:    Induction:   Airway Management Planned:   Additional Equipment:   Intra-op Plan:   Post-operative Plan:   Informed Consent: I have reviewed the patients History and Physical, chart, labs and discussed the procedure including the risks, benefits and alternatives for the proposed anesthesia with the patient or authorized representative who has indicated his/her understanding and acceptance.     Plan Discussed with: Anesthesiologist, CRNA and Surgeon  Anesthesia Plan Comments: (For C/Section for non-reassuring FHR tracing.)       Anesthesia Quick Evaluation

## 2013-02-23 NOTE — Progress Notes (Signed)
Patient ID: Cynthia Patel, female   DOB: 09-Jan-1991, 22 y.o.   MRN: [id number]  S:  Pt feeling pressure at 11:15, no pain.  O:  Filed Vitals:   02/23/13 1105  BP: 119/74  Pulse: 89  Temp:   Resp:     CERV:  Complete/+1/caput and molding present Attempted pushing for about 1 hour. Pt pushing well first 30 minutes but began having decels in heartrate with contractions to 80s, good variability and good recovery.  Stopped pitocin, started pushing every other contraction. Not much progress with descent. Baby feels OP.   FHTs:  120s, mod variability, decels to 80=90s with contractions. After pushing for about an hour every other contraction, heart rate in 90s for 4-5 minutes. Turned pt to side and stopped pushing. Good recovery.  A/P Will labor down more, try repositioning to turn baby. Restart pitocin in about 30 minutes if FHTs permit.  Pelvis feels adequate. DIscussed possibility of c-section if baby does not tolerate further labor.  Napoleon Form, MD

## 2013-02-23 NOTE — OR Nursing (Signed)
Patient sent for at 1210.

## 2013-02-23 NOTE — Progress Notes (Signed)
Patient ID: Cynthia Patel, female   DOB: [birth year], 22 y.o.   MRN: [id number]  S:  Pt comfortable, states she feels pressure with contractions.  OCeasar Mons Vitals:   02/23/13 0905 02/23/13 0930 02/23/13 1002 02/23/13 1032  BP: 126/78 150/100 147/83 120/64  Pulse: 93 79 95 92  Temp:   98.4 F (36.9 C)   TempSrc:   Oral   Resp:   20   Height:      Weight:      SpO2:       Cervix:  9.5 (left lip)/100%/0  FHTs: 120s, mod var, decels to 70s with pushing TOCO:  q 2-3 min  A/P Will continue to labor down with pitocin and try exaggerated Sims position to turn baby Anticipate SVD  Napoleon Form, MD

## 2013-02-23 NOTE — Anesthesia Postprocedure Evaluation (Signed)
  Anesthesia Post-op Note  Patient: Cynthia Patel  Procedure(s) Performed: Procedure(s): Primary cesarean section with delivery of baby boy at 75. Apgars 8/9. (N/A)  Patient Location: PACU  Anesthesia Type:Epidural  Level of Consciousness: awake, alert  and oriented  Airway and Oxygen Therapy: Patient Spontanous Breathing  Post-op Pain: none  Post-op Assessment: Post-op Vital signs reviewed, Patient's Cardiovascular Status Stable, Respiratory Function Stable, Patent Airway, No signs of Nausea or vomiting, Pain level controlled, No headache and No backache  Post-op Vital Signs: Reviewed and stable  Complications: No apparent anesthesia complications

## 2013-02-24 ENCOUNTER — Encounter (HOSPITAL_COMMUNITY): Payer: Self-pay | Admitting: Family Medicine

## 2013-02-24 LAB — CBC
HCT: 26.6 % — ABNORMAL LOW (ref 36.0–46.0)
MCH: 26.5 pg (ref 26.0–34.0)
MCV: 79.2 fL (ref 78.0–100.0)
RBC: 3.36 MIL/uL — ABNORMAL LOW (ref 3.87–5.11)
RDW: 14.6 % (ref 11.5–15.5)
WBC: 18.8 10*3/uL — ABNORMAL HIGH (ref 4.0–10.5)

## 2013-02-24 NOTE — Anesthesia Postprocedure Evaluation (Signed)
Anesthesia Post Note  Patient: Cynthia Patel  Procedure(s) Performed: Procedure(s) (LRB): Primary cesarean section with delivery of baby boy at 41. Apgars 8/9. (N/A)  Anesthesia type: Epidural  Patient location: Mother/Baby  Post pain: Pain level controlled  Post assessment: Post-op Vital signs reviewed  Last Vitals:  Filed Vitals:   02/24/13 0553  BP: 124/64  Pulse: 92  Temp: 36.7 C  Resp: 20    Post vital signs: Reviewed  Level of consciousness:alert  Complications: No apparent anesthesia complications

## 2013-02-24 NOTE — Lactation Note (Signed)
This note was copied from the chart of Cynthia Patel. Lactation Consultation Note Mom request LC assistance. Mom attempting to latch baby on left in cradle hold. Enc mom to use cross cradle. Mom then stated that baby had been on that side for awhile and maybe it was time to change to the right. Offered to assist with football hold and mom accepts. Demonstrated to mom and dad how to set up the pillow nest for football hold, and how to sandwich the breast for a deep latch. Discussed hand expression, mom was able to return demonstrate hand expression with few drops of colostrum.  Mom and dad were able to position and latch baby. Baby maintains deep latch with rhythmic sucking and audible swallowing. Mom reassured and more confident. Questions answered.  Enc mom to call if she has any concerns.   Patient Name: Cynthia Mekhi Lascola [id number] Date: 02/24/2013 Reason for consult: Follow-up assessment   Maternal Data Has patient been taught Hand Expression?: Yes (Reviewed; mom return demonstration)  Feeding Feeding Type: Breast Milk Feeding method: Breast Length of feed: 10 min  LATCH Score/Interventions Latch: Grasps breast easily, tongue down, lips flanged, rhythmical sucking.  Audible Swallowing: A few with stimulation Intervention(s): Skin to skin;Hand expression  Type of Nipple: Everted at rest and after stimulation  Comfort (Breast/Nipple): Soft / non-tender     Hold (Positioning): Assistance needed to correctly position infant at breast and maintain latch. Intervention(s): Breastfeeding basics reviewed;Support Pillows;Position options;Skin to skin  LATCH Score: 8  Lactation Tools Discussed/Used     Consult Status Consult Status: Follow-up Follow-up type: In-patient    Octavio Manns [facility] 02/24/2013, 3:08 PM

## 2013-02-24 NOTE — Progress Notes (Signed)
Subjective: Postpartum Day 1: Cesarean Delivery Patient reports tolerating PO and no problems voiding.    Objective: Vital signs in last 24 hours: Temp:  [97.4 F (36.3 C)-98.8 F (37.1 C)] 98.1 F (36.7 C) (05/12 0553) Pulse Rate:  [39-106] 92 (05/12 0553) Resp:  [16-24] 20 (05/12 0553) BP: (101-150)/(53-104) 124/64 mmHg (05/12 0553) SpO2:  [93 %-100 %] 98 % (05/12 0553)  Physical Exam:  General: alert, cooperative and appears stated age Lochia: appropriate Uterine Fundus: firm Incision: no significant erythema DVT Evaluation: No evidence of DVT seen on physical exam.   Recent Labs  02/23/13 0057 02/24/13 0623  HGB 11.7* 8.9*  HCT 34.7* 26.6*    Assessment/Plan: Status post Cesarean section. Doing well postoperatively.  Continue current care. Possible discharge tomorrow. Breast feeding Nexplanon  Montreal Steidle S 02/24/2013, 7:34 AM

## 2013-02-24 NOTE — Progress Notes (Signed)
Ur chart review completed.  

## 2013-02-25 NOTE — Progress Notes (Signed)
Post Partum Day 2 S/P Csection Subjective: no complaints, up ad lib, voiding, tolerating PO and + flatus  Objective: Blood pressure 116/76, pulse 80, temperature 98.4 F (36.9 C), temperature source Oral, resp. rate 18, height 5\' 6"  (1.676 m), weight 127.007 kg (280 lb), last menstrual period 05/15/2012, SpO2 98.00%, unknown if currently breastfeeding.  Physical Exam:  General: alert, cooperative and no distress Lochia: appropriate Uterine Fundus: firm Incision: healing well DVT Evaluation: No evidence of DVT seen on physical exam.   Recent Labs  02/23/13 0057 02/24/13 0623  HGB 11.7* 8.9*  HCT 34.7* 26.6*    Assessment/Plan: Plan for discharge tomorrow Baby under the bili lights at this time.   LOS: 3 days   Cynthia Patel 02/25/2013, 7:59 AM

## 2013-02-26 ENCOUNTER — Encounter: Payer: Self-pay | Admitting: Obstetrics and Gynecology

## 2013-02-26 MED ORDER — OXYCODONE-ACETAMINOPHEN 5-325 MG PO TABS: 1.0000 | ORAL_TABLET | ORAL | Status: DC | PRN

## 2013-02-26 MED ORDER — IBUPROFEN 600 MG PO TABS: 600.0000 mg | ORAL_TABLET | Freq: Four times a day (QID) | ORAL | Status: DC

## 2013-02-26 NOTE — Discharge Summary (Signed)
Obstetric Discharge Summary Cynthia Patel is a 22 y.o. female who presented to the hospital in active labor and eventually had a c-section due to failure to progress.  She plans to both breast and bottle feed and will seek the nexplanon implant for her birth control needs. She hopes to have her OBGYN care here.    Reason for Admission: cesarean section Prenatal Procedures: none Intrapartum Procedures: cesarean: low cervical, transverse Postpartum Procedures: none Complications-Operative and Postpartum: none Hemoglobin  Date Value Range Status  02/24/2013 8.9* 12.0 - 15.0 g/dL Final     DELTA CHECK NOTED     REPEATED TO VERIFY     HCT  Date Value Range Status  02/24/2013 26.6* 36.0 - 46.0 % Final    Physical Exam:  General: alert, cooperative and no distress CVS: RRR, good S1 and S2, no RGM  Lungs: CTA bilaterally, no wheeze, crackles, or rhonchi  Abdomen: Normoactive bowel sounds x4, soft with no tenderness to palpation.  Lochia: appropriate Uterine Fundus: firm Incision: Well healing surgical scar  DVT Evaluation: No evidence of DVT seen on physical exam.  Negative Homan's sign.  Discharge Diagnoses: Term Pregnancy-delivered  Discharge Information: Date: 02/26/2013 Activity: pelvic rest Diet: routine Medications: PNV, Ibuprofen, Colace, Iron and Percocet Condition: stable Instructions: refer to practice specific booklet Discharge to: home   Newborn Data: Live born female  Birth Weight: 7 lb 6.5 oz (3360 g) APGAR: 8, 9  Home with mother.  Anna Genre 02/26/2013, 8:05 AM  I have seen and examined this patient and I agree with the above. Clelia Croft, Jalyiah Shelley 11:21 PM 03/02/2013

## 2013-02-26 NOTE — Care Management Note (Signed)
    Page 1 of 1   02/26/2013     2:03:46 PM   CARE MANAGEMENT NOTE 02/26/2013  Patient:  Cynthia Patel,Cynthia Patel   Account Number:  [id number]  Date Initiated:  02/26/2013  Documentation initiated by:  CRAFT,TERRI  Subjective/Objective Assessment:   Cynthia Patel born 02/[birth year]     Action/Plan:   to be discharged home on double phototherapy   Anticipated DC Date:  02/26/2013   Anticipated DC Plan:  HOME W HOME HEALTH SERVICES      DC Planning Services  CM consult      PAC Choice  DURABLE MEDICAL EQUIPMENT  HOME HEALTH   Choice offered to / List presented to:  C-6 Parent   DME arranged  Margaretann Loveless      DME agency  [facility]     [facility] arranged  HH-1 RN      [facility] agency  [facility]   Status of service:  Completed, signed off    Discharge Disposition:  HOME W HOME HEALTH SERVICES  Per UR Regulation:  Reviewed for med. necessity/level of care/duration of stay  Comments:  02/26/13, Kathi Der RNC-MNN, BSN, [phone], CM received referral.  CM met with pt's parents  and offered choice for [facility] services.  [facility] chosen.  Kristrin at [facility] contacted with order and confirmation received. All questions answered.

## 2013-04-03 ENCOUNTER — Encounter: Payer: Self-pay | Admitting: Obstetrics and Gynecology

## 2013-04-03 ENCOUNTER — Ambulatory Visit (INDEPENDENT_AMBULATORY_CARE_PROVIDER_SITE_OTHER): Payer: Medicaid Other | Admitting: Obstetrics & Gynecology

## 2013-04-03 DIAGNOSIS — Z30017 Encounter for initial prescription of implantable subdermal contraceptive: Secondary | ICD-10-CM

## 2013-04-03 MED ORDER — ETONOGESTREL 68 MG ~~LOC~~ IMPL
68.0000 mg | DRUG_IMPLANT | Freq: Once | SUBCUTANEOUS | Status: AC
  Administered 2013-04-03: 68 mg via SUBCUTANEOUS

## 2013-04-03 NOTE — Progress Notes (Signed)
Subjective:     Cynthia Patel is a 22 y.o. G73P1001 female who presents for a postpartum visit. She is s/p LTCS on 02/23/13 at [redacted]w[redacted]d forfailure to progress: arrest of descent and fetal intolerance to labor .I have fully reviewed the prenatal and intrapartum course.  Postpartum course has been uncomplicated. Baby's course has been uncomplicated. Baby is feeding by bottle. Bleeding no bleeding. Bowel function is normal. Bladder function is normal. Patient is sexually active. Contraception method is condoms and Nexplanon. Will have Nexplanon placed today. Postpartum depression screening: negative.  The following portions of the patient's history were reviewed and updated as appropriate: allergies, current medications, past family history, past medical history, past social history, past surgical history and problem list.  Review of Systems Pertinent items are noted in HPI.   Objective:    BP 124/92  Pulse 78  Temp(Src) 98.5 F (36.9 C) (Oral)  Ht 5\' 4"  (1.626 m)  Wt 230 lb 12.8 oz (104.69 kg)  BMI 39.6 kg/m2  Breastfeeding? No  General:  alert and no distress   Breasts:  inspection negative, no nipple discharge or bleeding, no masses or nodularity palpable  Lungs: clear to auscultation bilaterally  Heart:  regular rate and rhythm  Abdomen: soft, non-tender; bowel sounds normal; no masses,  no organomegaly   Vulva:  normal  Vagina: normal vagina  Cervix:  no lesions  Corpus: normal size, contour, position, consistency, mobility, non-tender  Adnexa:  normal adnexa and no mass, fullness, tenderness  Rectal Exam: Normal rectovaginal exam         Nexplanon Insertion Procedure Patient given informed consent, she signed consent form.  Patient does understand that irregular bleeding is a very common side effect of this medication. Pregnancy test was negative.  Appropriate time out taken.  Patient's left arm was prepped and draped in the usual sterile fashion.. The ruler used to measure and mark  insertion area.  Patient was prepped with alcohol swab and then injected with 3 ml of 1% lidocaine.  She was prepped with betadine, Nexplanon removed from packaging,  Device confirmed in needle, then inserted full length of needle and withdrawn per handbook instructions. Nexplanon was able to palpated in the patient's arm; patient palpated the insert herself. There was minimal blood loss.  Patient insertion site covered with guaze and a pressure bandage to reduce any bruising.  The patient tolerated the procedure well and was given post procedure instructions and told to call with any problems/concerns.  Assessment:   Normal postpartum exam.  Nexplanon placement  Plan:   1. Contraception: Nexplanon which was placed today 2. Routine preventative health maintenance measures emphasized; had normal pap smear during her prenatal care at [facility]

## 2013-04-03 NOTE — Patient Instructions (Signed)

## 2013-04-04 ENCOUNTER — Encounter: Payer: Self-pay | Admitting: *Deleted

## 2013-04-07 ENCOUNTER — Telehealth: Payer: Self-pay | Admitting: *Deleted

## 2013-04-07 NOTE — Telephone Encounter (Signed)
Called pt and informed pt that she can just come to front desk and they would be able to provide a letter for her to return to work.  There was no further questions.

## 2013-04-07 NOTE — Telephone Encounter (Signed)
Patient left message requesting a note to return to work. She forgot to ask for it at her postpartum appt last week.

## 2013-04-08 ENCOUNTER — Encounter: Payer: Self-pay | Admitting: Obstetrics & Gynecology

## 2014-08-17 ENCOUNTER — Encounter: Payer: Self-pay | Admitting: Obstetrics and Gynecology

## 2015-02-23 NOTE — H&P (Signed)
L&D Evaluation:  History:  HPI 24yo G1 at 499w1d by D=20wk U/S presents with c/o LOF x 2 days.  Good FM, no ctx or VB.  PNC at [facility].  PNL - O+, RI, VI, GBS+.   Presents with leaking fluid   Patient's Medical History obesity   Patient's Surgical History none   Medications Pre Natal Vitamins   Allergies amoxicillin   Social History none   Family History Non-Contributory   ROS:  ROS All systems were reviewed.  HEENT, CNS, GI, GU, Respiratory, CV, Renal and Musculoskeletal systems were found to be normal.   Exam:  Vital Signs stable  BP 133/70   Urine Protein not completed   General no apparent distress   Mental Status clear   Chest normal effort   Abdomen gravid, non-tender   Estimated Fetal Weight Average for gestational age, 8#   Pelvic no external lesions, ftp per RN   Mebranes Intact, nitrazine negative   FHT reactive NST   Ucx rare   Skin dry   Lymph no lymphadenopathy   Impression:  Impression G1 at 159w1d not in labor or ruptured   Plan:  Comments - labor precautions reivewed - set up IOL for 5/14 in the evening for post-dates - pt reassured and in agreement with management plan   Electronic Signatures: Orvan FalconerStansbury Clipp, Beatriz Settles K (MD)  (Signed 09-May-14 15:04)  Authored: L&D Evaluation   Last Updated: 09-May-14 15:04 by Garnette GunnerStansbury Clipp, Ali LoweEryn K (MD)

## 2015-02-23 NOTE — H&P (Signed)
L&D Evaluation:  History:  HPI 24yo G1 at 5627w2d by D=20wk U/S presents with c/o contractions every 5-6710min.  Good FM, no LOF or VB.  PNC at [facility].  PNL - O+, RI, VI, GBS+.   Presents with contractions   Patient's Medical History obesity   Patient's Surgical History none   Medications Pre Natal Vitamins   Allergies amoxicillin   Social History none   Family History Non-Contributory   ROS:  ROS All systems were reviewed.  HEENT, CNS, GI, GU, Respiratory, CV, Renal and Musculoskeletal systems were found to be normal.   Exam:  Vital Signs stable  borderline but no elevated BPs   Urine Protein not completed   General no apparent distress   Mental Status clear   Chest normal effort   Abdomen gravid, non-tender   Estimated Fetal Weight Average for gestational age, 8#   Pelvic no external lesions, 1/80/-3   Mebranes Intact   FHT reactive NST   Ucx every 5-6610min   Skin dry   Lymph no lymphadenopathy   Other Bedside U/S confirms adequate fluid >5cm   Impression:  Impression G1 at 5227w2d likely in latent labor.   Plan:  Comments - cervix unchanged after 3hr rule out - has previously been set up IOL for 5/14 in the evening for post-dates - will allow Dr. Bonney AidStaebler who is taking over today to discuss management options with patient   Electronic Signatures for Addendum Section:  Lorrene ReidStaebler, Andreas M (MD) (Signed Addendum [phone]-May-14 08:44)  Given no cervical change, unfavorable cervix, reassuring fetal and maternal monitoring, as well as fetus in OP position still discussed increased risk of C-section with IOL.  Discharge home with IOL scheduled for 5/14 cervidil   Electronic Signatures: Garnette GunnerStansbury Clipp, Ali LoweEryn K (MD)  (Signed 10-May-14 08:07)  Authored: L&D Evaluation   Last Updated: 10-May-14 08:44 by Lorrene ReidStaebler, Andreas M (MD)

## 2016-06-26 LAB — HM PAP SMEAR: HM Pap smear: NEGATIVE

## 2018-03-01 ENCOUNTER — Emergency Department (HOSPITAL_COMMUNITY)
Admission: EM | Admit: 2018-03-01 | Discharge: 2018-03-02 | Disposition: A | Discharge status: Home / Self Care | Payer: Self-pay | Attending: Emergency Medicine | Admitting: Emergency Medicine

## 2018-03-01 ENCOUNTER — Encounter (HOSPITAL_COMMUNITY): Payer: Self-pay | Admitting: Nurse Practitioner

## 2018-03-01 DIAGNOSIS — Z79899 Other long term (current) drug therapy: Secondary | ICD-10-CM | POA: Insufficient documentation

## 2018-03-01 DIAGNOSIS — R030 Elevated blood-pressure reading, without diagnosis of hypertension: Secondary | ICD-10-CM | POA: Insufficient documentation

## 2018-03-01 DIAGNOSIS — G510 Bell's palsy: Secondary | ICD-10-CM | POA: Insufficient documentation

## 2018-03-01 NOTE — ED Notes (Signed)
Pt states left eye and left side of lips are "drooping".

## 2018-03-01 NOTE — ED Triage Notes (Signed)
Pt states is c/o left eye disturbance/discomfort and inability to close the eye. Onset yesterday while she was on a cruise at the [place].

## 2018-03-01 NOTE — ED Notes (Signed)
Bed: WTR8 Expected date:  Expected time:  Means of arrival:  Comments: 

## 2018-03-02 LAB — POC URINE PREG, ED: Preg Test, Ur: NEGATIVE

## 2018-03-02 MED ORDER — VALACYCLOVIR HCL 500 MG PO TABS
1000.0000 mg | ORAL_TABLET | Freq: Once | ORAL | Status: AC
  Administered 2018-03-02: 1000 mg via ORAL
  Filled 2018-03-02: qty 2

## 2018-03-02 MED ORDER — HYPROMELLOSE (GONIOSCOPIC) 2.5 % OP SOLN: 1.0000 [drp] | OPHTHALMIC | 12 refills | Status: DC

## 2018-03-02 MED ORDER — PREDNISONE 20 MG PO TABS
60.0000 mg | ORAL_TABLET | Freq: Once | ORAL | Status: AC
  Administered 2018-03-02: 60 mg via ORAL
  Filled 2018-03-02: qty 3

## 2018-03-02 MED ORDER — FLUORESCEIN SODIUM 1 MG OP STRP
1.0000 | ORAL_STRIP | Freq: Once | OPHTHALMIC | Status: AC
  Administered 2018-03-02: 1 via OPHTHALMIC
  Filled 2018-03-02: qty 1

## 2018-03-02 MED ORDER — VALACYCLOVIR HCL 1 G PO TABS: 1000.0000 mg | ORAL_TABLET | Freq: Three times a day (TID) | ORAL | 0 refills | Status: AC

## 2018-03-02 MED ORDER — PREDNISONE 20 MG PO TABS: 60.0000 mg | ORAL_TABLET | Freq: Every day | ORAL | 0 refills | Status: AC

## 2018-03-02 MED ORDER — TETRACAINE HCL 0.5 % OP SOLN
1.0000 [drp] | Freq: Once | OPHTHALMIC | Status: AC
  Administered 2018-03-02: 1 [drp] via OPHTHALMIC
  Filled 2018-03-02: qty 4

## 2018-03-02 MED ORDER — ARTIFICIAL TEARS OPHTHALMIC OINT: TOPICAL_OINTMENT | Freq: Every day | OPHTHALMIC | 0 refills | Status: DC

## 2018-03-02 NOTE — Discharge Instructions (Signed)
Please see the information and instructions below regarding your visit.  Your diagnoses today include:  1. Bell palsy   2. Elevated blood pressure reading without diagnosis of hypertension    Your condition is caused usually by a virus that affects the 7th cranial nerve.  It usually resolves without difficulty, but I would like you to follow-up with a neurologist I listed in your paperwork, and I also placed a referral to them.  Tests performed today include: See side panel of your discharge paperwork for testing performed today. Vital signs are listed at the bottom of these instructions.   Medications prescribed:    Take any prescribed medications only as prescribed, and any over the counter medications only as directed on the packaging.  You are prescribed prednisone, a steroid. This is a medication to help reduce inflammation in the 7th cranial nerve.  Common side effects include upset stomach/nausea. You may take this medicine with food if this occurs. Other side effects include restlessness, difficulty sleeping, and increased sweating. Call your healthcare provider if these do not resolve after finishing the medication.  This medicine may increase your blood sugar so additional careful monitoring is needed of blood sugar if you have diabetes. Call your healthcare provider for any signs/symtpoms of high blood sugar such as confusion, feeling sleepy, more thirst, more hunger, passing urine more often, flushing, fast breathing, or breath that smells like fruit.  Valtrex.  You will take this 3 times a day for 7 days.  This is an antiviral.  You must apply the artificial tear to the eye every hour while awake.  You can use any over-the-counter preparation of artificial tears.  Please use Lacri-Lube, which is an ointment over your eyelids and tape the eye at night.  Is very important that you take the eye every single night.   Home care instructions:  Please follow any educational materials  contained in this packet.   Follow-up instructions: Please follow-up with your primary care provider as soon as possible for further evaluation of your symptoms if they are not completely improved.   Return instructions:  Please return to the Emergency Department if you experience worsening symptoms.  Please return to the emergency department for any increasing pain in the eye, difficulty seeing out of the eye, or any additional weakness or numbness it is not involving her face. Please return if you have any other emergent concerns.  Additional Information:   Your vital signs today were: BP (!) 138/96    Pulse 99    Temp 98.3 F (36.8 C) (Oral)    Resp 16    SpO2 100%  If your blood pressure (BP) was elevated on multiple readings during this visit above 130 for the top number or above 80 for the bottom number, please have this repeated by your primary care provider within one month. --------------  Thank you for allowing [place] to participate in your care today.

## 2018-03-02 NOTE — ED Provider Notes (Signed)
[facility]-EMERGENCY DEPT Provider Note   CSN: [id number] Arrival date & time: 03/01/18  2101     History   Chief Complaint Chief Complaint  Patient presents with  . Eye Pain    Left eye    HPI Cynthia Patel is a 27 y.o. female.  HPI   Patient is a 27 y.o. Female with no significant past medical history presenting for left sided eye irritation and facial paralysis. Patient reports that symptoms began 2 days ago while on vacation in the [place]. Patient reports initially noticing that she was unable to close her left eye and it became irritated. No drainage, purulent or clear. No visual disturbance. Patient reports that she also noticed a "tingling" sensation along bilateral upper lip when her symptoms started that has since resolved. Patient reports she developed left sided facial droop. Patient denies difficulty swallowing, difficulty breathing, weakness or numbness in extremities, or gait disturbance. Patient denies fever or chills. Patient denies any insect bites or skin lesions prior to or after the onset of her symptoms. No topical remedies applied for her symptoms.   History reviewed. No pertinent past medical history.  Patient Active Problem List   Diagnosis Date Noted  . Arrest of descent, delivered, current hospitalization 02/24/2013    Past Surgical History:  Procedure Laterality Date  . CESAREAN SECTION N/A 02/23/2013   Procedure: Primary cesarean section with delivery of baby boy at 3. Apgars 8/9.;  Surgeon: Reva Bores, MD;  Location: [facility];  Service: Obstetrics;  Laterality: N/A;     OB History    Gravida  1   Para  1   Term  1   Preterm  0   AB  0   Living  1     SAB  0   TAB  0   Ectopic  0   Multiple  0   Live Births  1            Home Medications    Prior to Admission medications   Medication Sig Start Date End Date Taking? Authorizing Provider  artificial tears (LACRILUBE) OINT ophthalmic ointment  Place into both eyes at bedtime. 03/02/18   Aviva Kluver B, PA-C  hydroxypropyl methylcellulose / hypromellose (ISOPTO TEARS / GONIOVISC) 2.5 % ophthalmic solution Place 1 drop into the left eye every hour. While awake. 03/02/18   Aviva Kluver B, PA-C  ibuprofen (ADVIL,MOTRIN) 600 MG tablet Take 1 tablet (600 mg total) by mouth every 6 (six) hours. 02/26/13   Arabella Merles, CNM  oxyCODONE-acetaminophen (PERCOCET/ROXICET) 5-325 MG per tablet Take 1-2 tablets by mouth every 4 (four) hours as needed. 02/26/13   Arabella Merles, CNM  predniSONE (DELTASONE) 20 MG tablet Take 3 tablets (60 mg total) by mouth daily for 4 days. 03/02/18 03/06/18  Aviva Kluver B, PA-C  Prenatal Vit-Fe Fumarate-FA (PRENATAL MULTIVITAMIN) TABS Take 1 tablet by mouth daily.    [provider]  valACYclovir (VALTREX) 1000 MG tablet Take 1 tablet (1,000 mg total) by mouth 3 (three) times daily for 7 days. You were given 1 dose in the emergency department. 03/02/18 03/09/18  Elisha Ponder, PA-C    Family History History reviewed. No pertinent family history.  Social History Social History   Tobacco Use  . Smoking status: Never Smoker  . Smokeless tobacco: Never Used  Substance Use Topics  . Alcohol use: No  . Drug use: No     Allergies   Amoxicillin  Review of Systems Review of Systems  Constitutional: Negative for chills and fever.  HENT: Negative for trouble swallowing.   Eyes: Positive for itching. Negative for visual disturbance.  Musculoskeletal: Negative for arthralgias and joint swelling.  Skin: Negative for rash.  Neurological: Positive for facial asymmetry. Negative for dizziness, light-headedness, numbness and headaches.     Physical Exam Updated Vital Signs BP 128/82 (BP Location: Left Arm)   Pulse 89   Temp 98.3 F (36.8 C) (Oral)   Resp 14   SpO2 100%   Physical Exam  Constitutional: She appears well-developed and well-nourished. No distress.  Sitting comfortably in bed.   HENT:  Head: Normocephalic and atraumatic.  Eyes: Conjunctivae are normal. Right eye exhibits no discharge. Left eye exhibits no discharge.  EOMI. Patient without any evidence of corneal abrasion as evidenced on fluorescein and tetracaine examination under Woods lamp.  Neck: Normal range of motion.  Cardiovascular: Normal rate and regular rhythm.  Intact, 2+ radial pulse.  Pulmonary/Chest:  Normal respiratory effort. Patient converses comfortably. No audible wheeze or stridor.  Abdominal: She exhibits no distension.  Musculoskeletal: Normal range of motion.  Neurological: She is alert.  Mental Status:  Alert, oriented, thought content appropriate, able to give a coherent history. Speech fluent without evidence of aphasia. Able to follow 2 step commands without difficulty.  Cranial Nerves:  II:  Peripheral visual fields grossly normal, pupils equal, round, reactive to light III,IV, VI: ptosis not present, extra-ocular motions intact bilaterally  V,VII: Patient exhibits left sided facial droop with inability to close left eye and inability to raise left eyebrow. VIII: hearing grossly normal to voice  X: uvula elevates symmetrically  XI: bilateral shoulder shrug symmetric and strong XII: midline tongue extension without fassiculations Motor:  Normal tone. 5/5 in upper and lower extremities bilaterally including strong and equal grip strength and dorsiflexion/plantar flexion Sensory: Light touch normal in all extremities.  Gait: normal gait and balance. No ataxia. CV: distal pulses palpable throughout   Skin: Skin is warm and dry. She is not diaphoretic.  Psychiatric: She has a normal mood and affect. Her behavior is normal. Judgment and thought content normal.  Nursing note and vitals reviewed.    ED Treatments / Results  Labs (all labs ordered are listed, but only abnormal results are displayed) Labs Reviewed  POC URINE PREG, ED    EKG None  Radiology No results  found.  Procedures Procedures (including critical care time)  Medications Ordered in ED Medications  tetracaine (PONTOCAINE) 0.5 % ophthalmic solution 1 drop (1 drop Left Eye Given 03/02/18 0112)  fluorescein ophthalmic strip 1 strip (1 strip Left Eye Given 03/02/18 0112)  predniSONE (DELTASONE) tablet 60 mg (60 mg Oral Given 03/02/18 0211)  valACYclovir (VALTREX) tablet 1,000 mg (1,000 mg Oral Given 03/02/18 0211)     Initial Impression / Assessment and Plan / ED Course  I have reviewed the triage vital signs and the nursing notes.  Pertinent labs & imaging results that were available during my care of the patient were reviewed by me and considered in my medical decision making (see chart for details).    Patient is nontoxic-appearing and neurologically intact.  Patient exhibits left-sided facial weakness with ocular closing, as well as inability to elevate the left eyebrow, consistent with Bell's palsy.  No evidence of central cause such as CVA, intracranial bleeding, or other associated symptoms besides the facial weakness.  Patient please prescribe Valtrex, prednisone, and given instructions for artificial tears and Lacri-Lube application as  well as eye taping at night.  Ambulatory referral to neurology placed for patient.  Consult to case management also placed to assist patient with primary care resources, as she is currently without primary care.  No evidence of corneal abrasion on Woods lamp examination.  Blood pressure noted to be elevated today.  This was noted to patient.  Primary care resources given.  This is a shared visit with Dr. Brett Canales Rancour. Patient was independently evaluated by this attending physician. Attending physician consulted in evaluation and discharge management.  Final Clinical Impressions(s) / ED Diagnoses   Final diagnoses:  Bell palsy  Elevated blood pressure reading without diagnosis of hypertension    ED Discharge Orders        Ordered     Ambulatory referral to Neurology    Comments:  An appointment is requested in approximately: 1 week; Bell palsy follow up   03/02/18 0123    valACYclovir (VALTREX) 1000 MG tablet  3 times daily     03/02/18 0126    predniSONE (DELTASONE) 20 MG tablet  Daily     03/02/18 0126    artificial tears (LACRILUBE) OINT ophthalmic ointment  Daily at bedtime     03/02/18 0126    hydroxypropyl methylcellulose / hypromellose (ISOPTO TEARS / GONIOVISC) 2.5 % ophthalmic solution  Every hour     03/02/18 0126       Delia Chimes 03/03/18 0003    Glynn Octave, MD 03/03/18 [phone]

## 2018-05-06 ENCOUNTER — Encounter: Payer: Self-pay | Admitting: Diagnostic Neuroimaging

## 2018-05-06 ENCOUNTER — Ambulatory Visit: Payer: Self-pay | Admitting: Diagnostic Neuroimaging

## 2018-05-06 VITALS — BP 141/90 | HR 72 | Ht 65.5 in | Wt 282.0 lb

## 2018-05-06 DIAGNOSIS — G510 Bell's palsy: Secondary | ICD-10-CM

## 2018-05-06 NOTE — Progress Notes (Signed)
[facility]  PATIENT: Cynthia Patel DOB: 11-04-1990  REFERRING CLINICIAN: ER  HISTORY FROM: patient  REASON FOR VISIT: new consult    HISTORICAL  CHIEF COMPLAINT:  Chief Complaint  Patient presents with  . NP  Aviva Kluver PA/ ED  . L Bells Palsy    Started 2 months, Feb 28 2018, was in [place].  Has been on 2 courses of steroid, valtrex.  after last course was better and now seem to be getting worse again.     HISTORY OF PRESENT ILLNESS:   27 year old female here for evaluation of left-sided Bell's palsy.  Patient was on vacation in the [place] when she noticed left lip tingling and left eye twitching sensation.  She then developed weakness in the left side of her face.  Patient returned to the [place] and went to the emergency room 2 days later.  She was diagnosed with left-sided Bell's palsy.  She was treated with acyclovir and prednisone.  Symptoms have significantly improved.  In the last 3 days she has had slightly more left eye irritation with tearing and dryness alternating.  She is started to use artificial tears again.  Patient denies any symptoms in her left arm or left leg.  No symptoms in the right side of her body.  No slurred speech or vision loss.  No headaches.  No similar symptoms in the past.  No other prodromal triggering factors.    REVIEW OF SYSTEMS: Full 14 system review of systems performed and negative with exception of: Negative only as per HPI.  ALLERGIES: Allergies  Allergen Reactions  . Amoxicillin Itching and Rash    HOME MEDICATIONS: Outpatient Medications Prior to Visit  Medication Sig Dispense Refill  . artificial tears (LACRILUBE) OINT ophthalmic ointment Place into both eyes at bedtime. 7 g 0  . hydroxypropyl methylcellulose / hypromellose (ISOPTO TEARS / GONIOVISC) 2.5 % ophthalmic solution Place 1 drop into the left eye every hour. While awake. 15 mL 12  . ibuprofen (ADVIL,MOTRIN) 600 MG tablet Take 1 tablet (600 mg  total) by mouth every 6 (six) hours. 50 tablet 1  . oxyCODONE-acetaminophen (PERCOCET/ROXICET) 5-325 MG per tablet Take 1-2 tablets by mouth every 4 (four) hours as needed. 50 tablet 0  . Prenatal Vit-Fe Fumarate-FA (PRENATAL MULTIVITAMIN) TABS Take 1 tablet by mouth daily.     No facility-administered medications prior to visit.     PAST MEDICAL HISTORY: No past medical history on file.  PAST SURGICAL HISTORY: Past Surgical History:  Procedure Laterality Date  . CESAREAN SECTION N/A 02/23/2013   Procedure: Primary cesarean section with delivery of baby boy at 42. Apgars 8/9.;  Surgeon: Reva Bores, MD;  Location: [facility];  Service: Obstetrics;  Laterality: N/A;    FAMILY HISTORY: Family History  Problem Relation Age of Onset  . Hypertension Father   . Diabetes Father     SOCIAL HISTORY:  Social History   Socioeconomic History  . Marital status: Single    Spouse name: Not on file  . Number of children: Not on file  . Years of education: Not on file  . Highest education level: Not on file  Occupational History  . Not on file  Social Needs  . Financial resource strain: Not on file  . Food insecurity:    Worry: Not on file    Inability: Not on file  . Transportation needs:    Medical: Not on file    Non-medical: Not on file  Tobacco  Use  . Smoking status: Never Smoker  . Smokeless tobacco: Never Used  Substance and Sexual Activity  . Alcohol use: Yes    Comment: occ  . Drug use: No  . Sexual activity: Yes    Birth control/protection: Condom  Lifestyle  . Physical activity:    Days per week: Not on file    Minutes per session: Not on file  . Stress: Not on file  Relationships  . Social connections:    Talks on phone: Not on file    Gets together: Not on file    Attends religious service: Not on file    Active member of club or organization: Not on file    Attends meetings of clubs or organizations: Not on file    Relationship status: Not on file  .  Intimate partner violence:    Fear of current or ex partner: Not on file    Emotionally abused: Not on file    Physically abused: Not on file    Forced sexual activity: Not on file  Other Topics Concern  . Not on file  Social History Narrative   Lives home with family.  Has one child.  Works as Public house manager at [employer].  Education: certification.  Caffeine mountain dew every other day.     PHYSICAL EXAM  GENERAL EXAM/CONSTITUTIONAL: Vitals:  Vitals:   05/06/18 0823  BP: (!) 141/90  Pulse: 72  Weight: 282 lb (127.9 kg)  Height: 5' 5.5" (1.664 m)     Body mass index is 46.21 kg/m.  Visual Acuity Screening   Right eye Left eye Both eyes  Without correction:     With correction: 20/20 20/40      Patient is in no distress; well developed, nourished and groomed; neck is supple  CARDIOVASCULAR:  Examination of carotid arteries is normal; no carotid bruits  Regular rate and rhythm, no murmurs  Examination of peripheral vascular system by observation and palpation is normal  EYES:  Ophthalmoscopic exam of optic discs and posterior segments is normal; no papilledema or hemorrhages  MUSCULOSKELETAL:  Gait, strength, tone, movements noted in Neurologic exam below  NEUROLOGIC: MENTAL STATUS:  No flowsheet data found.  awake, alert, oriented to person, place and time  recent and remote memory intact  normal attention and concentration  language fluent, comprehension intact, naming intact,   fund of knowledge appropriate  CRANIAL NERVE:   2nd - no papilledema on fundoscopic exam  2nd, 3rd, 4th, 6th - pupils equal and reactive to light, visual fields full to confrontation, extraocular muscles intact, no nystagmus  5th - facial sensation symmetric  7th - facial strength --> DECR LEFT EYE BLINKING; SLIGHTLY DECR LEFT EYE CLOSURE; SLIGHT DECR LEFT MOUTH SMILE; SLIGHTLY DECR LEFT EYE BROW RAISE  8th - hearing intact  9th - palate elevates symmetrically, uvula  midline  11th - shoulder shrug symmetric  12th - tongue protrusion midline  MOTOR:   normal bulk and tone, full strength in the BUE, BLE  SENSORY:   normal and symmetric to light touch, temperature, vibration  COORDINATION:   finger-nose-finger, fine finger movements normal  REFLEXES:   deep tendon reflexes TRACE and symmetric  GAIT/STATION:   narrow based gait    DIAGNOSTIC DATA (LABS, IMAGING, TESTING) - I reviewed patient records, labs, notes, testing and imaging myself where available.  Lab Results  Component Value Date   WBC 18.8 (H) 02/24/2013   HGB 8.9 (L) 02/24/2013   HCT 26.6 (L) 02/24/2013  MCV 79.2 02/24/2013   PLT 216 02/24/2013      Component Value Date/Time   NA 134 (L) 02/23/2013 0057   NA 134 (L) 11/04/2012 1503   K 3.8 02/23/2013 0057   K 3.3 (L) 11/04/2012 1503   CL 100 02/23/2013 0057   CL 102 11/04/2012 1503   CO2 20 02/23/2013 0057   CO2 22 11/04/2012 1503   GLUCOSE 96 02/23/2013 0057   GLUCOSE 77 11/04/2012 1503   BUN 6 02/23/2013 0057   BUN 5 (L) 11/04/2012 1503   CREATININE 0.46 (L) 02/23/2013 0057   CREATININE 0.43 (L) 11/04/2012 1503   CALCIUM 9.2 02/23/2013 0057   CALCIUM 8.7 11/04/2012 1503   PROT 6.8 02/23/2013 0057   PROT 7.7 11/04/2012 1503   ALBUMIN 3.0 (L) 02/23/2013 0057   ALBUMIN 3.2 (L) 11/04/2012 1503   AST 14 02/23/2013 0057   AST 19 11/04/2012 1503   ALT 6 02/23/2013 0057   ALT 17 11/04/2012 1503   ALKPHOS 149 (H) 02/23/2013 0057   ALKPHOS 65 11/04/2012 1503   BILITOT 0.5 02/23/2013 0057   BILITOT 0.5 11/04/2012 1503   GFRNONAA >90 02/23/2013 0057   GFRNONAA >60 11/04/2012 1503   GFRAA >90 02/23/2013 0057   GFRAA >60 11/04/2012 1503   No results found for: CHOL, HDL, LDLCALC, LDLDIRECT, TRIG, CHOLHDL No results found for: ZOXW9UHGBA1C No results found for: VITAMINB12 No results found for: TSH      ASSESSMENT AND PLAN  27 y.o. year old female here with left-sided Bell's palsy.  Symptoms gradually  improving.  Some slight worsening in the last 3 days with left eye irritation, but facial strength has significantly improved.   Dx: left facial nerve palsy (bell's palsy)  1. Left-sided Bell's palsy      PLAN:  - Prognosis remains good for patient.  Symptoms should continue to improve over the next few weeks in few months. - continue supportive care - follow up with eye care and eye clinic  Return if symptoms worsen or fail to improve, for return to PCP.    Suanne MarkerVIKRAM R. Ali Mohl, MD 05/06/2018, 8:44 AM Certified in Neurology, Neurophysiology and Neuroimaging  [facility] [address], [address] [place], [place] [id number] [phone]

## 2018-05-06 NOTE — Patient Instructions (Signed)
-   continue eye care

## 2018-08-27 DIAGNOSIS — H5213 Myopia, bilateral: Secondary | ICD-10-CM | POA: Diagnosis not present

## 2018-11-20 DIAGNOSIS — Z131 Encounter for screening for diabetes mellitus: Secondary | ICD-10-CM | POA: Diagnosis not present

## 2018-11-20 DIAGNOSIS — R5383 Other fatigue: Secondary | ICD-10-CM | POA: Diagnosis not present

## 2018-11-20 DIAGNOSIS — Z0000 Encounter for general adult medical examination without abnormal findings: Secondary | ICD-10-CM | POA: Diagnosis not present

## 2018-11-22 DIAGNOSIS — E559 Vitamin D deficiency, unspecified: Secondary | ICD-10-CM | POA: Diagnosis not present

## 2018-11-22 DIAGNOSIS — Z13220 Encounter for screening for lipoid disorders: Secondary | ICD-10-CM | POA: Diagnosis not present

## 2018-11-22 DIAGNOSIS — Z131 Encounter for screening for diabetes mellitus: Secondary | ICD-10-CM | POA: Diagnosis not present

## 2018-11-22 DIAGNOSIS — R5383 Other fatigue: Secondary | ICD-10-CM | POA: Diagnosis not present

## 2018-12-20 DIAGNOSIS — J029 Acute pharyngitis, unspecified: Secondary | ICD-10-CM | POA: Diagnosis not present

## 2018-12-20 DIAGNOSIS — J020 Streptococcal pharyngitis: Secondary | ICD-10-CM | POA: Diagnosis not present

## 2019-01-06 ENCOUNTER — Ambulatory Visit: Payer: Self-pay | Admitting: Dietician

## 2019-01-16 ENCOUNTER — Ambulatory Visit: Payer: Self-pay | Admitting: Dietician

## 2019-01-21 ENCOUNTER — Encounter: Payer: Self-pay | Admitting: Dietician

## 2019-04-25 DIAGNOSIS — G510 Bell's palsy: Secondary | ICD-10-CM | POA: Diagnosis not present

## 2019-04-25 MED FILL — predniSONE 20 MG TABS: 20 | 5 days supply | Qty: 15 | Fill #0

## 2019-07-22 ENCOUNTER — Telehealth: Payer: Self-pay | Admitting: Family Medicine

## 2019-07-22 NOTE — Telephone Encounter (Signed)
has questions about birth control

## 2019-07-22 NOTE — Telephone Encounter (Signed)
TC to patient who wanted to know when her Nexplanon was inserted. Patient states she lost her nexplanon card with dates on it and has been wondering about the insertion date. Per [facility], patient had nexplanon removal/reinsertion on 04/16/2017. Patient informed and counseled on need for PE and to call [facility] next month to schedule one. Also counseled nexplanon good until at least next July and possibly up to 4 years. Patient states understanding and denies any further questions.Jenetta Downer, RN

## 2019-10-06 DIAGNOSIS — N898 Other specified noninflammatory disorders of vagina: Secondary | ICD-10-CM | POA: Diagnosis not present

## 2019-12-16 DIAGNOSIS — T304 Corrosion of unspecified body region, unspecified degree: Secondary | ICD-10-CM | POA: Diagnosis not present

## 2020-01-01 DIAGNOSIS — L298 Other pruritus: Secondary | ICD-10-CM | POA: Diagnosis not present

## 2020-03-24 ENCOUNTER — Ambulatory Visit: Payer: Self-pay

## 2020-03-24 ENCOUNTER — Encounter: Payer: Self-pay | Admitting: Advanced Practice Midwife

## 2020-03-24 ENCOUNTER — Other Ambulatory Visit: Payer: Self-pay

## 2020-03-24 ENCOUNTER — Ambulatory Visit (LOCAL_COMMUNITY_HEALTH_CENTER): Payer: Self-pay | Admitting: Advanced Practice Midwife

## 2020-03-24 VITALS — BP 124/94 | Ht 64.0 in | Wt 260.8 lb

## 2020-03-24 DIAGNOSIS — Z3009 Encounter for other general counseling and advice on contraception: Secondary | ICD-10-CM

## 2020-03-24 DIAGNOSIS — Z30017 Encounter for initial prescription of implantable subdermal contraceptive: Secondary | ICD-10-CM

## 2020-03-24 DIAGNOSIS — E669 Obesity, unspecified: Secondary | ICD-10-CM | POA: Insufficient documentation

## 2020-03-24 LAB — WET PREP FOR TRICH, YEAST, CLUE
Trichomonas Exam: NEGATIVE
Yeast Exam: NEGATIVE

## 2020-03-24 MED ORDER — METRONIDAZOLE 500 MG PO TABS: 500.0000 mg | ORAL_TABLET | Freq: Two times a day (BID) | ORAL | 0 refills | Status: AC

## 2020-03-24 MED ORDER — ETONOGESTREL 68 MG ~~LOC~~ IMPL
68.0000 mg | DRUG_IMPLANT | Freq: Once | SUBCUTANEOUS | Status: AC
Start: 2020-03-24 — End: 2020-03-24
  Administered 2020-03-24: 68 mg via SUBCUTANEOUS

## 2020-03-24 NOTE — Progress Notes (Signed)
Here today for PE and Nexplanon removal/reinsertion. Last PE and Pap here was 06/26/2016 and Nexplanon placed here 04/16/2017. Wants all STD screening today. Tawny Hopping, RN

## 2020-03-24 NOTE — Progress Notes (Signed)
[place]et [place] results reviewed. Patient treated for BV per standing orders. Nasiah Lehenbauer, RN  

## 2020-03-24 NOTE — Progress Notes (Signed)
Family Planning Visit- Initial Visit  Subjective:  Cynthia Patel is a 29 y.o. SBF G1P1001   being seen today for an initial well woman visit and to discuss family planning options.  She is currently using Nexplanon  for pregnancy prevention. Patient reports she does not want a pregnancy in the next year.  Patient has the following medical conditions has Left-sided Bell's palsy and Obesity BMI=44.7 on their problem list.  Chief Complaint  Patient presents with  . Procedure    Patient reports Nexplanon inserted 04/16/2017.  LMP not on Nexplanon.  Last sex 01/2020 with condom.  Last PE and pap 06/26/16 neg.  08/08/12 ASCUS.  12/05/11 ASCUS -HPV.  Last ETOH 03/20/20 (1 glass wine) 1x/mo.  Patient denies any chronic medical conditions  Body mass index is 44.77 kg/m. - Patient is eligible for diabetes screening based on BMI and age >01?  not applicable UU7O ordered? not applicable  Patient reports 1 of partners in last year. Desires STI screening?  Yes  Has patient been screened once for HCV in the past?  No  No results found for: HCVAB  Does the patient have current of drug use, have a partner with drug use, and/or has been incarcerated since last result? No  If yes-- Screen for HCV through [facility]   Does the patient meet criteria for HBV testing? No  Criteria:  -Household, sexual or needle sharing contact with HBV -History of drug use -HIV positive -Those with known Hep C   Health Maintenance Due  Topic Date Due  . Hepatitis C Screening  Never done  . COVID-19 Vaccine (1) Never done  . TETANUS/TDAP  Never done  . PAP-Cervical Cytology Screening  06/27/2019  . PAP SMEAR-Modifier  06/27/2019    ROS  The following portions of the patient's history were reviewed and updated as appropriate: allergies, current medications, past family history, past medical history, past social history, past surgical history and problem list. Problem list updated.   See flowsheet for  other program required questions.  Objective:   Vitals:   03/24/20 0954  BP: (!) 124/94  Weight: 260 lb 12.8 oz (118.3 kg)  Height: 5\' 4"  (1.626 m)    Physical Exam    Assessment and Plan:  Cynthia Patel is a 29 y.o. female presenting to the [facility] Department for an initial well woman exam/family planning visit  Contraception counseling: Reviewed all forms of birth control options in the tiered based approach. available including abstinence; over the counter/barrier methods; hormonal contraceptive medication including pill, patch, ring, injection,contraceptive implant, ECP; hormonal and nonhormonal IUDs; permanent sterilization options including vasectomy and the various tubal sterilization modalities. Risks, benefits, and typical effectiveness rates were reviewed.  Questions were answered.  Written information was also given to the patient to review.  Patient desires Nexplanon removal/reinsertion, this was prescribed for patient. She will follow up in prn for surveillance.  She was told to call with any further questions, or with any concerns about this method of contraception.  Emphasized use of condoms 100% of the time for STI prevention.  Patient was offered ECP. ECP was not accepted by the patient. ECP counseling was not given - see RN documentation  1. Obesity, unspecified classification, unspecified obesity type, unspecified whether serious comorbidity present   2. Family planning Treat wet mount per standing orders Immunization nurse consult Please repeat BP again today - WET PREP FOR [place], YEAST, CLUE - Syphilis Serology, [place] [facility] -  HIV [place] [facility] - Chlamydia/Gonorrhea [place] [facility] - IGP, rfx Aptima HPV ASCU  3. Encounter for initial prescription of implantable subdermal contraceptive Nexplanon Removal and Insertion  Patient identified, informed consent performed, consent signed.   Patient does understand that irregular bleeding is a very  common side effect of this medication. She was advised to have backup contraception for one week after replacement of the implant. Patient deemed to meet WHO criteria for being reasonably certain she is not pregnant.  Appropriate time out taken. Nexplanon site identified. Area prepped in usual sterile fashon. 3 ml of 1% lidocaine with epinephrine was used to anesthetize the area at the distal end of the implant. A small stab incision was made right beside the implant on the distal portion. The Nexplanon rod was grasped using hemostats and removed without difficulty. There was minimal blood loss. There were no complications.   Confirmed correct location of insertion site. The insertion site was identified 8-10 cm (3-4 inches) from the medial epicondyle of the humerus and 3-5 cm (1.25-2 inches) posterior to (below) the sulcus (groove) between the biceps and triceps muscles of the patient's left arm. New Nexplanon removed from packaging, Device confirmed in needle, then inserted full length of needle and withdrawn per handbook instructions. Nexplanon was able to palpated in the patient's left arm; patient palpated the insert herself.  There was minimal blood loss. Patient insertion site covered with guaze and a pressure bandage to reduce any bruising. The patient tolerated the procedure well and was given post procedure instructions.   Nexplanon:   Counseled patient to take OTC analgesic starting as soon as lidocaine starts to wear off and take regularly for at least 48 hr to decrease discomfort.  Specifically to take with food or milk to decrease stomach upset and for IB 600 mg (3 tablets) every 6 hrs; IB 800 mg (4 tablets) every 8 hrs; or Aleve 2 tablets every 12 hrs.       Return if symptoms worsen or fail to improve.  No future appointments.  Alberteen Spindle, CNM

## 2020-03-24 NOTE — Addendum Note (Signed)
Addended by: Tawny Hopping A on: 03/24/2020 12:36 PM   Modules accepted: Orders

## 2020-03-26 LAB — IGP, RFX APTIMA HPV ASCU: PAP Smear Comment: 0

## 2020-04-09 DIAGNOSIS — H5213 Myopia, bilateral: Secondary | ICD-10-CM | POA: Diagnosis not present

## 2021-02-27 ENCOUNTER — Emergency Department: Payer: Self-pay

## 2021-02-27 ENCOUNTER — Other Ambulatory Visit: Payer: Self-pay

## 2021-02-27 ENCOUNTER — Emergency Department
Admission: EM | Admit: 2021-02-27 | Discharge: 2021-02-28 | Disposition: A | Discharge status: Home / Self Care | Payer: Self-pay | Attending: Emergency Medicine | Admitting: Emergency Medicine

## 2021-02-27 DIAGNOSIS — Z8669 Personal history of other diseases of the nervous system and sense organs: Secondary | ICD-10-CM | POA: Insufficient documentation

## 2021-02-27 DIAGNOSIS — R55 Syncope and collapse: Secondary | ICD-10-CM | POA: Insufficient documentation

## 2021-02-27 HISTORY — DX: Bell's palsy: G51.0

## 2021-02-27 LAB — COMPREHENSIVE METABOLIC PANEL
ALT: 13 U/L (ref 0–44)
AST: 17 U/L (ref 15–41)
Albumin: 3.9 g/dL (ref 3.5–5.0)
Alkaline Phosphatase: 56 U/L (ref 38–126)
Anion gap: 8 (ref 5–15)
BUN: 8 mg/dL (ref 6–20)
CO2: 23 mmol/L (ref 22–32)
Calcium: 8.4 mg/dL — ABNORMAL LOW (ref 8.9–10.3)
Chloride: 108 mmol/L (ref 98–111)
Creatinine, Ser: 0.71 mg/dL (ref 0.44–1.00)
GFR, Estimated: >60 mL/min (ref >60)
Glucose, Bld: 102 mg/dL — ABNORMAL HIGH (ref 70–99)
Potassium: 3.7 mmol/L (ref 3.5–5.1)
Sodium: 139 mmol/L (ref 135–145)
Total Bilirubin: 0.5 mg/dL (ref 0.3–1.2)
Total Protein: 7.5 g/dL (ref 6.5–8.1)

## 2021-02-27 LAB — TSH: TSH: 1.631 u[IU]/mL (ref 0.350–4.500)

## 2021-02-27 LAB — CBC
HCT: 39.8 % (ref 36.0–46.0)
Hemoglobin: 13 g/dL (ref 12.0–15.0)
MCH: 29.5 pg (ref 26.0–34.0)
MCHC: 32.7 g/dL (ref 30.0–36.0)
MCV: 90.2 fL (ref 80.0–100.0)
Platelets: 263 10*3/uL (ref 150–400)
RBC: 4.41 MIL/uL (ref 3.87–5.11)
RDW: 12.3 % (ref 11.5–15.5)
WBC: 4.5 10*3/uL (ref 4.0–10.5)
nRBC: 0 % (ref 0.0–0.2)

## 2021-02-27 NOTE — ED Provider Notes (Signed)
[facility] Emergency Department Provider Note   ____________________________________________    I have reviewed the triage vital signs and the nursing notes.   HISTORY  Chief Complaint Loss of Consciousness     HPI Cynthia Patel is a 30 y.o. female who presents with possible loss of consciousness.  Patient reports she was at a party today and apparently her friends and family told her that she was staring straight ahead for short period of time and did not seem to be responding to them.  She does not really remember this.  She states this may have happened once in the past as well.  No neurodeficits.  No headache.  No history of seizures.  Does have a history of Bell's palsy.  Currently feels well  Past Medical History:  Diagnosis Date  . Bell's palsy     Patient Active Problem List   Diagnosis Date Noted  . Obesity BMI=44.7 03/24/2020  . Left-sided Bell's palsy 05/06/2018    Past Surgical History:  Procedure Laterality Date  . CESAREAN SECTION N/A 02/23/2013   Procedure: Primary cesarean section with delivery of baby boy at 53. Apgars 8/9.;  Surgeon: Reva Bores, MD;  Location: [facility];  Service: Obstetrics;  Laterality: N/A;    Prior to Admission medications   Medication Sig Start Date End Date Taking? Authorizing Provider  artificial tears (LACRILUBE) OINT ophthalmic ointment Place into both eyes at bedtime. Patient not taking: Reported on 03/24/2020 03/02/18   Aviva Kluver B, PA-C  hydroxypropyl methylcellulose / hypromellose (ISOPTO TEARS / GONIOVISC) 2.5 % ophthalmic solution Place 1 drop into the left eye every hour. While awake. Patient not taking: Reported on 03/24/2020 03/02/18   Aviva Kluver B, PA-C  ibuprofen (ADVIL,MOTRIN) 600 MG tablet Take 1 tablet (600 mg total) by mouth every 6 (six) hours. 02/26/13   Arabella Merles, CNM     Allergies Amoxicillin  Family History  Problem Relation Age of Onset  . Hypertension Mother    . Hypertension Father   . Diabetes Father   . Kidney disease Father   . Hyperlipidemia Maternal Grandmother   . Hypertension Maternal Grandmother   . Migraines Maternal Grandmother   . Heart disease Paternal Grandmother     Social History Social History   Tobacco Use  . Smoking status: Never Smoker  . Smokeless tobacco: Never Used  Substance Use Topics  . Alcohol use: Yes    Comment: occ  . Drug use: No    Review of Systems  Constitutional: No fever/chills Eyes: No visual changes.  ENT: No sore throat. Cardiovascular: Denies chest pain. Respiratory: Denies shortness of breath. Gastrointestinal: No abdominal pain.  No nausea, no vomiting.   Genitourinary: Negative for dysuria. Musculoskeletal: Negative for back pain. Skin: Negative for rash. Neurological: Negative for headaches or weakness   ____________________________________________   PHYSICAL EXAM:  VITAL SIGNS: ED Triage Vitals  Enc Vitals Group     BP 02/27/21 2012 (!) 175/95     Pulse Rate 02/27/21 2012 (!) 106     Resp 02/27/21 2012 18     Temp 02/27/21 2012 98.7 F (37.1 C)     Temp Source 02/27/21 2012 Oral     SpO2 02/27/21 2012 99 %     Weight 02/27/21 2015 120.8 kg (266 lb 6.4 oz)     Height 02/27/21 2015 1.651 m (5\' 5" )     Head Circumference --      Peak Flow --  Pain Score 02/27/21 2015 5     Pain Loc --      Pain Edu? --      Excl. in GC? --     Constitutional: Alert and oriented. No acute distress. Pleasant and interactive Eyes: Conjunctivae are normal.  Head: Atraumatic.  Neck:  Painless ROM Cardiovascular: Normal rate, regular rhythm.   Good peripheral circulation. Respiratory: Normal respiratory effort.  No retractions.   Genitourinary: deferred Musculoskeletal: No lower extremity tenderness nor edema.  Warm and well perfused Neurologic:  Normal speech and language. No gross focal neurologic deficits are appreciated.  Skin:  Skin is warm, dry and intact. No rash  noted. Psychiatric: Mood and affect are normal. Speech and behavior are normal.  ____________________________________________   LABS (all labs ordered are listed, but only abnormal results are displayed)  Labs Reviewed  COMPREHENSIVE METABOLIC PANEL - Abnormal; Notable for the following components:      Result Value   Glucose, Bld 102 (*)    Calcium 8.4 (*)    All other components within normal limits  CBC  TSH   ____________________________________________  EKG  ED ECG REPORT I, Jene Every, the attending physician, personally viewed and interpreted this ECG.  Date: 02/27/2021  Rhythm: normal sinus rhythm QRS Axis: normal Intervals: normal ST/T Wave abnormalities: normal Narrative Interpretation: no evidence of acute ischemia  ____________________________________________  RADIOLOGY  CT head reviewed by me, no acute abnormality ____________________________________________   PROCEDURES  Procedure(s) performed: No  Procedures   Critical Care performed: No ____________________________________________   INITIAL IMPRESSION / ASSESSMENT AND PLAN / ED COURSE  Pertinent labs & imaging results that were available during my care of the patient were reviewed by me and considered in my medical decision making (see chart for details).  Patient presents with symptoms as detailed above, no syncopal episode reported, question possible seizure although unlikely.  Regardless we will obtain CT head, labs and monitor here  Patient feeling well, work-up is overall unremarkable.  Unclear cause of her symptoms, will have her follow-up closely with neurology for further evaluation.    ____________________________________________   FINAL CLINICAL IMPRESSION(S) / ED DIAGNOSES  Final diagnoses:  Near syncope        Note:  This document was prepared using Dragon voice recognition software and may include unintentional dictation errors.   Jene Every, MD 02/27/21  2322

## 2021-02-27 NOTE — ED Triage Notes (Addendum)
Pt presents to ER stating she had a syncopal episode that happened around 1930 today.  Per people with pt at time, pt had a 5-10 minute episode of spacing out where she was awake, but not responsive.  Visitor states it was like pt was looking "through her." Pt currently A&O x4 and is only c/o left sided headache.  Pt was sitting down at time and did not fall.

## 2021-02-28 ENCOUNTER — Encounter (HOSPITAL_COMMUNITY): Payer: Self-pay | Admitting: Emergency Medicine

## 2021-02-28 ENCOUNTER — Emergency Department (HOSPITAL_COMMUNITY)
Admission: EM | Admit: 2021-02-28 | Discharge: 2021-02-28 | Disposition: A | Discharge status: Home / Self Care | Payer: Self-pay | Attending: Emergency Medicine | Admitting: Emergency Medicine

## 2021-02-28 DIAGNOSIS — R569 Unspecified convulsions: Secondary | ICD-10-CM | POA: Insufficient documentation

## 2021-02-28 DIAGNOSIS — I10 Essential (primary) hypertension: Secondary | ICD-10-CM | POA: Insufficient documentation

## 2021-02-28 NOTE — ED Provider Notes (Signed)
[facility]-EMERGENCY DEPT Provider Note   CSN: [id number] Arrival date & time: 02/28/21  0115     History Chief Complaint  Patient presents with  . Possible Seizure    Cynthia Patel is a 30 y.o. female presents to the Emergency Department complaining of acute, resolved episode of altered mental status.  Patient reports that around 5 PM she was at her son's birthday party when she had 5 to 7 minutes of "spacing out."  She reports she does not remember any of the event and family remembers states she was staring into space and making repetitive movements with her right hand.  No oral trauma.  Family denies falls or other types of muscle twitching.  Patient reports she had a similar episode approximately 3 weeks ago which was witnessed by her best friend lasting approximately 15 to 30 seconds.  She has no history of seizures.  She does have a history of Bell's palsy onset several years ago with mild residual facial numbness.  Patient reports she regularly gets headaches.  She had a headache last night and this morning but it resolved prior to the party with Tylenol.  She reports she often has headaches behind her left eye that are persistent for several days and are often accompanied by photophobia and nausea.  She has never been diagnosed with migraine headaches or treated by a neurologist for them.  No other known aggravating or alleviating factors.  Patient denies alcohol and drug usage.  Patient reports she was seen earlier at [facility] but felt as if she left without any answers, though she presents to the emergency department here for further evaluation and request for an EEG.  The history is provided by the patient and medical records. No language interpreter was used.       Past Medical History:  Diagnosis Date  . Bell's palsy     Patient Active Problem List   Diagnosis Date Noted  . Obesity BMI=44.7 03/24/2020  . Left-sided Bell's palsy 05/06/2018     Past Surgical History:  Procedure Laterality Date  . CESAREAN SECTION N/A 02/23/2013   Procedure: Primary cesarean section with delivery of baby boy at [id number]. Apgars 8/9.;  Surgeon: Reva Boresanya S Pratt, MD;  Location: [facility];  Service: Obstetrics;  Laterality: N/A;     OB History    Gravida  1   Para  1   Term  1   Preterm  0   AB  0   Living  1     SAB  0   IAB  0   Ectopic  0   Multiple  0   Live Births  1           Family History  Problem Relation Age of Onset  . Hypertension Mother   . Hypertension Father   . Diabetes Father   . Kidney disease Father   . Hyperlipidemia Maternal Grandmother   . Hypertension Maternal Grandmother   . Migraines Maternal Grandmother   . Heart disease Paternal Grandmother     Social History   Tobacco Use  . Smoking status: Never Smoker  . Smokeless tobacco: Never Used  Substance Use Topics  . Alcohol use: Yes    Comment: occ  . Drug use: No    Home Medications Prior to Admission medications   Medication Sig Start Date End Date Taking? Authorizing Provider  artificial tears (LACRILUBE) OINT ophthalmic ointment Place into both eyes at bedtime. Patient not taking:  Reported on 03/24/2020 03/02/18   Aviva Kluver B, PA-C  hydroxypropyl methylcellulose / hypromellose (ISOPTO TEARS / GONIOVISC) 2.5 % ophthalmic solution Place 1 drop into the left eye every hour. While awake. Patient not taking: Reported on 03/24/2020 03/02/18   Aviva Kluver B, PA-C  ibuprofen (ADVIL,MOTRIN) 600 MG tablet Take 1 tablet (600 mg total) by mouth every 6 (six) hours. 02/26/13   Arabella Merles, CNM    Allergies    Amoxicillin  Review of Systems   Review of Systems  Constitutional: Negative for appetite change, diaphoresis, fatigue, fever and unexpected weight change.  HENT: Negative for mouth sores.   Eyes: Negative for visual disturbance.  Respiratory: Negative for cough, chest tightness, shortness of breath and wheezing.   Cardiovascular:  Negative for chest pain.  Gastrointestinal: Negative for abdominal pain, constipation, diarrhea, nausea and vomiting.  Endocrine: Negative for polydipsia, polyphagia and polyuria.  Genitourinary: Negative for dysuria, frequency, hematuria and urgency.  Musculoskeletal: Negative for back pain and neck stiffness.  Skin: Negative for rash.  Allergic/Immunologic: Negative for immunocompromised state.  Neurological: Positive for seizures and headaches. Negative for syncope and light-headedness.  Hematological: Does not bruise/bleed easily.  Psychiatric/Behavioral: Negative for sleep disturbance. The patient is not nervous/anxious.     Physical Exam Updated Vital Signs BP (!) 178/115 (BP Location: Left Arm)   Pulse 89   Temp 98.1 F (36.7 C) (Oral)   Resp 19   Ht 5\' 5"  (1.651 m)   Wt 120.7 kg   SpO2 100%   BMI 44.26 kg/m   Physical Exam Vitals and nursing note reviewed.  Constitutional:      General: She is not in acute distress.    Appearance: She is well-developed. She is not diaphoretic.  HENT:     Head: Normocephalic and atraumatic.  Eyes:     General: No scleral icterus.    Conjunctiva/sclera: Conjunctivae normal.     Pupils: Pupils are equal, round, and reactive to light.     Comments: No horizontal, vertical or rotational nystagmus  Neck:     Comments: Full active and passive ROM without pain No midline or paraspinal tenderness No nuchal rigidity or meningeal signs Cardiovascular:     Rate and Rhythm: Normal rate and regular rhythm.  Pulmonary:     Effort: Pulmonary effort is normal. No respiratory distress.     Breath sounds: Normal breath sounds. No wheezing or rales.  Abdominal:     Palpations: Abdomen is soft.     Tenderness: There is no abdominal tenderness. There is no guarding or rebound.  Musculoskeletal:        General: Normal range of motion.     Cervical back: Normal range of motion and neck supple.  Lymphadenopathy:     Cervical: No cervical  adenopathy.  Skin:    General: Skin is warm and dry.     Findings: No rash.  Neurological:     Mental Status: She is alert and oriented to person, place, and time.     Cranial Nerves: No cranial nerve deficit.     Motor: No abnormal muscle tone.     Coordination: Coordination normal.     Comments: Mental Status:  Alert, oriented, thought content appropriate. Speech fluent without evidence of aphasia. Able to follow 2 step commands without difficulty.  Cranial Nerves:  II:  Peripheral visual fields grossly normal, pupils equal, round, reactive to light III,IV, VI: ptosis not present, extra-ocular motions intact bilaterally  V,VII: smile symmetric, facial light touch  sensation equal VIII: hearing grossly normal bilaterally  IX,X: midline uvula rise  XI: bilateral shoulder shrug equal and strong XII: midline tongue extension  Motor:  5/5 in upper and lower extremities bilaterally including strong and equal grip strength and dorsiflexion/plantar flexion Sensory: Pinprick and light touch normal in all extremities.  Cerebellar: normal finger-to-nose with bilateral upper extremities Gait: normal gait and balance CV: distal pulses palpable throughout   Psychiatric:        Behavior: Behavior normal.        Thought Content: Thought content normal.        Judgment: Judgment normal.     ED Results / Procedures / Treatments    Radiology CT Head Wo Contrast  Result Date: 02/27/2021 CLINICAL DATA:  Syncopal episode EXAM: CT HEAD WITHOUT CONTRAST TECHNIQUE: Contiguous axial images were obtained from the base of the skull through the vertex without intravenous contrast. COMPARISON:  None. FINDINGS: Brain: No evidence of acute infarction, hemorrhage, hydrocephalus, extra-axial collection or mass lesion/mass effect. Dense dural calcifications. Partially empty sella turcica. Vascular: No hyperdense vessel or unexpected calcification. 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 Negative for fracture or focal lesion. Sinuses/Orbits: Polypoid mucosal thickening of the left maxillary sinus otherwise the paranasal sinuses are predominantly clear. Other: Mastoid air cells are predominantly clear. Partial pneumatization of the petrous apices. Temporal/middle cranial fossa IMPRESSION: No acute intracranial findings. Electronically Signed   By: Maudry Mayhew MD   On: 02/27/2021 23:24    Procedures Procedures   Medications Ordered in ED Medications - No data to display  ED Course  I have reviewed the triage vital signs and the nursing notes.  Pertinent labs & imaging results that were available during my care of the patient were reviewed by me and considered in my medical decision making (see chart for details).  Clinical Course as of 02/28/21 0318  Mon Feb 28, 2021  0311 BP(!): 178/115 Hypertensive [HM]    Clinical Course User Index [HM] Leola Fiore, Boyd Kerbs   MDM Rules/Calculators/A&P                           Patient presents for additional evaluation after possible seizure earlier in the evening.  Long discussion with patient about her symptoms, possible triggers.  Repeat physical exam here shows no neurologic deficits.  I personally reviewed the labs and imaging from [facility].  These are overall reassuring.  Patient was noted to have an apparent arachnoid granulation however there was no acute intercranial hemorrhage or other intracranial abnormality.  At this time given normal neurologic exam I would not recommend additional work-up in the emergency department.  Discussed this with patient in addition to close follow-up with neurology, seizure precautions and reasons to return immediately to the emergency department.  Counseled specifically on driving, sleep hygiene and alcohol usage.  Patient states  understanding and is in agreement with this plan.  Of note, patient hypertensive at triage.  Patient was very anxious upon my initial evaluation and I suspect this is a contributing factor.  Also of note, patient was hypertensive when evaluated at [facility].  Repeat blood pressure:  BP (!) 151/109 (BP Location: Left Arm)   Pulse 74   Temp 98.2 F (36.8 C) (Oral)   Resp 16   Ht 5\' 5"  (  1.651 m)   Wt 120.7 kg   SpO2 100%   BMI 44.26 kg/m   Blood pressure improved here with patient's improved anxiety.  Less likely hypertensive urgency or TIA given normal work-up at [facility] but both remain possible..  Patient with normal neurologic exam at this time, no headache or vision changes.  Again strongly encouraged neurology follow-up and primary care follow-up for blood pressure monitoring.   Final Clinical Impression(s) / ED Diagnoses Final diagnoses:  Seizure-like activity (HCC)  Hypertension, unspecified type    Rx / DC Orders ED Discharge Orders    None       Traniyah Hallett, Boyd Kerbs 02/28/21 0318    Gilda Crease, MD 02/28/21 [phone]

## 2021-02-28 NOTE — ED Triage Notes (Signed)
Patient arrives after being discharged from [place]. Patient states tonight around 43 when she was at her son's birthday party, she had an episode of fixed gaze with nystagmus, hand twitching and unresponsiveness. Patient states her family brought her inside where she proceeded to "snap out of it." Patient frustrated because [facility] could not do an EEG.

## 2021-02-28 NOTE — ED Notes (Signed)
Pt presenting to ED with her sister. Pt reports that earlier today she had an episode of loss of consciousness. Sister at bedside states that pt "went out of it" for 5-6 minutes, eyes were moving back and forth but was not answering anyone. Pt states she does not remember the episode. She states she called one of her friends after this happened and he told her that she had a similar episode that lasted about 15 seconds when they were out eating dinner last week. Pt denies hx of seizures but does have hx of bells palsy with some unresolved symptoms from that (slight facial droop). Reports having headache in L side of head. Denies nausea. AAO NAD VSS

## 2021-02-28 NOTE — Discharge Instructions (Addendum)
1. Medications: Ibuprofen or Tylenol for headache 2. Treatment: Seizure precautions given (see below) 3. Follow Up: With neurology in the next 1 to 2 days.  Return immediately to the emergency department for recurrent symptoms or if patient becomes lethargic, begins vomiting, develops double vision, speech difficulty, problems walking or other change in mental status.   Per [facility] statutes, patients with seizures are not allowed to drive until  they have been seizure-free for six months. Use caution when using heavy equipment or power tools. Avoid working on ladders or at heights. Take showers instead of baths. Ensure the water temperature is not too high on the home water heater. Do not go swimming alone. When caring for infants or small children, sit down when holding, feeding, or changing them to minimize risk of injury to the child in the event you have a seizure.   Also, Maintain good sleep hygiene. Avoid alcohol.  Drink plenty of fluids and manage headaches

## 2021-03-02 ENCOUNTER — Encounter: Payer: Self-pay | Admitting: Diagnostic Neuroimaging

## 2021-03-02 ENCOUNTER — Ambulatory Visit (INDEPENDENT_AMBULATORY_CARE_PROVIDER_SITE_OTHER): Payer: Self-pay | Admitting: Diagnostic Neuroimaging

## 2021-03-02 VITALS — BP 153/100 | HR 82 | Ht 64.0 in | Wt 272.0 lb

## 2021-03-02 DIAGNOSIS — R4689 Other symptoms and signs involving appearance and behavior: Secondary | ICD-10-CM

## 2021-03-02 MED ORDER — LEVETIRACETAM 500 MG PO TABS: 500.0000 mg | ORAL_TABLET | Freq: Two times a day (BID) | ORAL | 12 refills | Status: DC

## 2021-03-02 NOTE — Patient Instructions (Signed)
ABNL SPELLS (possible complex partial seizures)  - start levetiracetam 500mg  twice a day   - check MRI brain and EEG  - According to [place] law, you can not drive unless you are seizure / syncope free for at least 6 months and under physician's care.   - Please maintain precautions. Do not participate in activities where a loss of awareness could harm you or someone else. No swimming alone, no tub bathing, no hot tubs, no driving, no operating motorized vehicles (cars, ATVs, motocycles, etc), lawnmowers, power tools or firearms. No standing at heights, such as rooftops, ladders or stairs. Avoid hot objects such as stoves, heaters, open fires. Wear a helmet when riding a bicycle, scooter, skateboard, etc. and avoid areas of traffic. Set your water heater to 120 degrees or less.

## 2021-03-02 NOTE — Progress Notes (Signed)
[facility]  PATIENT: Cynthia Patel DOB: 07/17/1991  REFERRING CLINICIAN: Nira Retort HISTORY FROM: patient  REASON FOR VISIT: new consult    HISTORICAL  CHIEF COMPLAINT:  Chief Complaint  Patient presents with  . Seizure-like activity    Rm 7 ED referral sister- April, fiancee- Gala Romney  "family witnessed event 02/27/21 x 5 minutes, has no recollection, woke with headache, sleepiness; similar event 3 weeks prior x 15 seconds"    HISTORY OF PRESENT ILLNESS:   30 year old female here for evaluation of seizure-like activity.  02/28/2019 patient was sitting down outside all of a sudden she blacked out, had abnormal movements of her eyes, abnormal movements of her hand lasting for few minutes.  Her eyes stayed open.  She is not able to talk or respond.  After while she was able to respond and talk.  3 weeks prior patient had another similar event.  Patient has had some issues with academics when she was younger requiring an IEP.  Her family always felt that she had "ADD" tendencies.  Also has had history of migraines.  No family history of seizures.   REVIEW OF SYSTEMS: Full 14 system review of systems performed and negative with exception of: As per HPI.  ALLERGIES: Allergies  Allergen Reactions  . Amoxicillin Itching and Rash    HOME MEDICATIONS: Outpatient Medications Prior to Visit  Medication Sig Dispense Refill  . acetaminophen (TYLENOL) 500 MG tablet Take 500 mg by mouth every 6 (six) hours as needed.    Marland Kitchen ibuprofen (ADVIL) 200 MG tablet Take 200 mg by mouth every 6 (six) hours as needed.    Marland Kitchen artificial tears (LACRILUBE) OINT ophthalmic ointment Place into both eyes at bedtime. (Patient not taking: No sig reported) 7 g 0  . hydroxypropyl methylcellulose / hypromellose (ISOPTO TEARS / GONIOVISC) 2.5 % ophthalmic solution Place 1 drop into the left eye every hour. While awake. (Patient not taking: No sig reported) 15 mL 12  . ibuprofen  (ADVIL,MOTRIN) 600 MG tablet Take 1 tablet (600 mg total) by mouth every 6 (six) hours. (Patient not taking: Reported on 03/02/2021) 50 tablet 1   No facility-administered medications prior to visit.    PAST MEDICAL HISTORY: Past Medical History:  Diagnosis Date  . Bell's palsy     PAST SURGICAL HISTORY: Past Surgical History:  Procedure Laterality Date  . CESAREAN SECTION N/A 02/23/2013   Procedure: Primary cesarean section with delivery of baby boy at 43. Apgars 8/9.;  Surgeon: Reva Bores, MD;  Location: [facility];  Service: Obstetrics;  Laterality: N/A;    FAMILY HISTORY: Family History  Problem Relation Age of Onset  . Hypertension Mother   . Hypertension Father   . Diabetes Father   . Kidney disease Father   . Hyperlipidemia Maternal Grandmother   . Hypertension Maternal Grandmother   . Migraines Maternal Grandmother   . Heart disease Paternal Grandmother     SOCIAL HISTORY: Social History   Socioeconomic History  . Marital status: Single    Spouse name: Not on file  . Number of children: 1  . Years of education: Not on file  . 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 Smoking status: Never Smoker  . Smokeless tobacco: Never Used  Substance and Sexual Activity  . Alcohol use: Yes    Comment: occ  . Drug use: No  . Sexual  activity: Yes    Birth control/protection: Condom, Implant  Other Topics Concern  . Not on file  Social History Narrative   Lives home with family.  Has one child.  Works as Public house manager at [employer].  Education: certification.     Caffeine mountain dew every other day.   Social Determinants of Health   Financial Resource Strain: Not on file  Food Insecurity: Not on file  Transportation Needs: Not on file  Physical Activity: Not on file  Stress: Not on file  Social Connections: Not on file  Intimate Partner Violence:  Not At Risk  . Fear of Current or Ex-Partner: No  . Emotionally Abused: No  . Physically Abused: No  . Sexually Abused: No     PHYSICAL EXAM  GENERAL EXAM/CONSTITUTIONAL: Vitals:  Vitals:   03/02/21 1308  BP: (!) 153/100  Pulse: 82  Weight: 272 lb (123.4 kg)  Height: 5\' 4"  (1.626 m)     Body mass index is 46.69 kg/m. Wt Readings from Last 3 Encounters:  03/02/21 272 lb (123.4 kg)  02/28/21 266 lb (120.7 kg)  02/27/21 266 lb 6.4 oz (120.8 kg)     Patient is in no distress; well developed, nourished and groomed; neck is supple  CARDIOVASCULAR:  Examination of carotid arteries is normal; no carotid bruits  Regular rate and rhythm, no murmurs  Examination of peripheral vascular system by observation and palpation is normal  EYES:  Ophthalmoscopic exam of optic discs and posterior segments is normal; no papilledema or hemorrhages  No exam data present  MUSCULOSKELETAL:  Gait, strength, tone, movements noted in Neurologic exam below  NEUROLOGIC: MENTAL STATUS:  No flowsheet data found.  awake, alert, oriented to person, place and time  recent and remote memory intact  normal attention and concentration  language fluent, comprehension intact, naming intact  fund of knowledge appropriate  CRANIAL NERVE:   2nd - no papilledema on fundoscopic exam  2nd, 3rd, 4th, 6th - pupils equal and reactive to light, visual fields full to confrontation, extraocular muscles intact, no nystagmus  5th - facial sensation symmetric  7th - facial strength symmetric  8th - hearing intact  9th - palate elevates symmetrically, uvula midline  11th - shoulder shrug symmetric  12th - tongue protrusion midline  MOTOR:   normal bulk and tone, full strength in the BUE, BLE  SENSORY:   normal and symmetric to light touch, temperature, vibration  COORDINATION:   finger-nose-finger, fine finger movements normal  REFLEXES:   deep tendon reflexes present and  symmetric  GAIT/STATION:   narrow based gait     DIAGNOSTIC DATA (LABS, IMAGING, TESTING) - I reviewed patient records, labs, notes, testing and imaging myself where available.  Lab Results  Component Value Date   WBC 4.5 02/27/2021   HGB 13.0 02/27/2021   HCT 39.8 02/27/2021   MCV 90.2 02/27/2021   PLT 263 02/27/2021      Component Value Date/Time   NA 139 02/27/2021 2215   NA 134 (L) 11/04/2012 1503   K 3.7 02/27/2021 2215   K 3.3 (L) 11/04/2012 1503   CL 108 02/27/2021 2215   CL 102 11/04/2012 1503   CO2 23 02/27/2021 2215   CO2 22 11/04/2012 1503   GLUCOSE 102 (H) 02/27/2021 2215   GLUCOSE 77 11/04/2012 1503   BUN 8 02/27/2021 2215   BUN 5 (L) 11/04/2012 1503   CREATININE 0.71 02/27/2021 2215   CREATININE 0.43 (L) 11/04/2012 1503   CALCIUM 8.4 (  L) 02/27/2021 2215   CALCIUM 8.7 11/04/2012 1503   PROT 7.5 02/27/2021 2215   PROT 7.7 11/04/2012 1503   ALBUMIN 3.9 02/27/2021 2215   ALBUMIN 3.2 (L) 11/04/2012 1503   AST 17 02/27/2021 2215   AST 19 11/04/2012 1503   ALT 13 02/27/2021 2215   ALT 17 11/04/2012 1503   ALKPHOS 56 02/27/2021 2215   ALKPHOS 65 11/04/2012 1503   BILITOT 0.5 02/27/2021 2215   BILITOT 0.5 11/04/2012 1503   GFRNONAA >60 02/27/2021 2215   GFRNONAA >60 11/04/2012 1503   GFRAA >90 02/23/2013 0057   GFRAA >60 11/04/2012 1503   No results found for: CHOL, HDL, LDLCALC, LDLDIRECT, TRIG, CHOLHDL No results found for: ZOXW9U No results found for: VITAMINB12 Lab Results  Component Value Date   TSH 1.631 02/27/2021    02/27/2021 CT head [I reviewed images myself and agree with interpretation. -VRP]  - Normal    ASSESSMENT AND PLAN  30 y.o. year old female here with at least 2 episodes of staring spell, abnormal eye movements, abnormal hand movements, lasting a few minutes with postictal confusion.  May represent complex partial seizures.  We will proceed with work-up and start antiseizure medications.   Dx:  1. Spell of abnormal  behavior      PLAN:  ABNL SPELLS (possible complex partial seizures)  - start levetiracetam 500mg  twice a day   - check MRI brain and EEG  - According to [place] law, you can not drive unless you are seizure / syncope free for at least 6 months and under physician's care.   - Please maintain precautions. Do not participate in activities where a loss of awareness could harm you or someone else. No swimming alone, no tub bathing, no hot tubs, no driving, no operating motorized vehicles (cars, ATVs, motocycles, etc), lawnmowers, power tools or firearms. No standing at heights, such as rooftops, ladders or stairs. Avoid hot objects such as stoves, heaters, open fires. Wear a helmet when riding a bicycle, scooter, skateboard, etc. and avoid areas of traffic. Set your water heater to 120 degrees or less.    Orders Placed This Encounter  Procedures  . MR BRAIN W WO CONTRAST  . EEG adult    Meds ordered this encounter  Medications  . levETIRAcetam (KEPPRA) 500 MG tablet    Sig: Take 1 tablet (500 mg total) by mouth 2 (two) times daily.    Dispense:  60 tablet    Refill:  12    Return in about 6 months (around 09/02/2021).    09/04/2021, MD 03/02/2021, 1:43 PM Certified in Neurology, Neurophysiology and Neuroimaging  [facility] [address] [place], [place] [phone]

## 2021-03-03 ENCOUNTER — Encounter: Payer: Self-pay | Admitting: Diagnostic Neuroimaging

## 2021-03-15 ENCOUNTER — Ambulatory Visit (INDEPENDENT_AMBULATORY_CARE_PROVIDER_SITE_OTHER): Payer: Self-pay | Admitting: Diagnostic Neuroimaging

## 2021-03-15 ENCOUNTER — Other Ambulatory Visit: Payer: Self-pay

## 2021-03-15 DIAGNOSIS — R569 Unspecified convulsions: Secondary | ICD-10-CM

## 2021-03-15 DIAGNOSIS — R4689 Other symptoms and signs involving appearance and behavior: Secondary | ICD-10-CM

## 2021-03-17 ENCOUNTER — Other Ambulatory Visit: Payer: Self-pay

## 2021-03-17 ENCOUNTER — Ambulatory Visit
Admission: RE | Admit: 2021-03-17 | Discharge: 2021-03-17 | Disposition: A | Discharge status: Home / Self Care | Payer: Self-pay | Source: Ambulatory Visit | Attending: Diagnostic Neuroimaging | Admitting: Diagnostic Neuroimaging

## 2021-03-17 DIAGNOSIS — R4689 Other symptoms and signs involving appearance and behavior: Secondary | ICD-10-CM

## 2021-03-17 MED ORDER — GADOBENATE DIMEGLUMINE 529 MG/ML IV SOLN
20.0000 mL | Freq: Once | INTRAVENOUS | Status: AC | PRN
  Administered 2021-03-17: 20 mL via INTRAVENOUS

## 2021-03-17 NOTE — Procedures (Signed)
   [facility]  EEG (ELECTROENCEPHALOGRAM) REPORT   STUDY DATE: 03/15/21 PATIENT NAME: Cynthia Patel DOB: 07-21-1991 MRN: [id number]  ORDERING CLINICIAN: Joycelyn Schmid, MD   TECHNOLOGIST: Ardis Hughs  TECHNIQUE: Electroencephalogram was recorded utilizing standard 10-20 system of lead placement and reformatted into average and bipolar montages.  RECORDING TIME: 26 minutes  ACTIVATION: hyperventilation and photic stimulation  CLINICAL INFORMATION: 30 year old female with abnormal spells.  FINDINGS: Posterior dominant background rhythms, which attenuate with eye opening, ranging 10-11 hertz and 40-50 microvolts.  Rare sharp waves noted in right anterior temporal region(F8, T8). No other focal, lateralizing, epileptiform activity or seizures are seen. Patient recorded in the awake and drowsy state. EKG channel shows regular rhythm of 70-80 beats per minute.   IMPRESSION:   EEG in the awake and drowsy states demonstrating: - Rare sharp waves noted in right anterior temporal region (F8, T8) are potentially epileptiform discharges. - Otherwise normal background rhythms.    INTERPRETING PHYSICIAN:  Suanne Marker, MD Certified in Neurology, Neurophysiology and Neuroimaging  [facility] [address], [address] [place], [place] [address] [phone]

## 2021-03-23 ENCOUNTER — Encounter: Payer: Self-pay | Admitting: *Deleted

## 2021-04-07 ENCOUNTER — Encounter: Payer: Self-pay | Admitting: *Deleted

## 2021-04-13 ENCOUNTER — Telehealth: Payer: Self-pay | Admitting: Diagnostic Neuroimaging

## 2021-04-13 NOTE — Telephone Encounter (Signed)
Pt requesting a call back, says she still have seizure like episodes, she has been taking the  levETIRAcetam (KEPPRA) 500 MG tablet. Wants to know what she should do.

## 2021-04-13 NOTE — Telephone Encounter (Signed)
Called patient and informed her per Dr Marjory Lies to increase levetiracetm to 1000 mg twice a day. Advised she let us know how she is doing next week, may call on call MD over weekend if she has any questions, concerns. She repeated directions correctly, verbalized understanding, appreciation.

## 2021-04-13 NOTE — Telephone Encounter (Signed)
Called patient, stated she took Keppra last night then after 9 pm, was on phone and felt herself start fading. She could hear grandmother but not really respond. Grandmother called sister who did face-time, stated patient was emotionless, minimal responses,  looked tired, it lasted about 1 minute per sister. After event patient felt wiped out, slept and woke with headache. She's taken tylenol and ibuprofen for relief. She could remember event later on. Her last event was 3 weeks ago. She asked if keppra needs to be increased, what to do. Advised will discuss with Dr Marjory Lies, call her back. Patient verbalized understanding, appreciation.

## 2021-04-25 ENCOUNTER — Encounter: Payer: Self-pay | Admitting: *Deleted

## 2021-04-25 MED ORDER — LEVETIRACETAM 1000 MG PO TABS: 1000.0000 mg | ORAL_TABLET | Freq: Two times a day (BID) | ORAL | 5 refills | Status: DC

## 2021-04-25 NOTE — Telephone Encounter (Signed)
New levetiracetam Rx sent to pharmacy per MD VO.

## 2021-04-25 NOTE — Telephone Encounter (Signed)
Pt called, the increase to Keppra 1000mg  is working out good, there is no issues. Need a refill for Keppra, due to the increase in the dosage. Would like a call from the nurse.

## 2021-04-25 NOTE — Addendum Note (Signed)
Addended by: [place] Pink on: 04/25/2021 09:36 AM   Modules accepted: Orders

## 2021-05-23 ENCOUNTER — Telehealth: Payer: Self-pay

## 2021-05-23 DIAGNOSIS — R4689 Other symptoms and signs involving appearance and behavior: Secondary | ICD-10-CM

## 2021-05-23 NOTE — Telephone Encounter (Signed)
Patient is requesting a referral to [facility], [address] [place], for a second opinion regarding her epilepsy/seizure disorder.

## 2021-05-25 NOTE — Telephone Encounter (Signed)
I have placed the referral, per approval from Dr. Marjory Lies.

## 2021-05-25 NOTE — Addendum Note (Signed)
Addended by: Ann Maki on: 05/25/2021 01:18 PM   Modules accepted: Orders

## 2021-06-01 ENCOUNTER — Telehealth: Payer: Self-pay | Admitting: Diagnostic Neuroimaging

## 2021-06-01 NOTE — Telephone Encounter (Signed)
Faxed referral to [facility] ph # [phone] fax # [phone].

## 2021-06-07 ENCOUNTER — Telehealth: Payer: Self-pay | Admitting: *Deleted

## 2021-06-07 NOTE — Telephone Encounter (Signed)
Called patient to offer FU to discuss medications. She stated that she doesn't want to increase Keppra any further, has had fatigue, weakness of hands. She prefers to come in, has a ride. Scheduled for tomorrow.. Patient verbalized understanding, appreciation.

## 2021-06-08 ENCOUNTER — Telehealth (INDEPENDENT_AMBULATORY_CARE_PROVIDER_SITE_OTHER): Payer: Self-pay | Admitting: Diagnostic Neuroimaging

## 2021-06-08 ENCOUNTER — Encounter: Payer: Self-pay | Admitting: *Deleted

## 2021-06-08 ENCOUNTER — Ambulatory Visit: Payer: Self-pay | Admitting: Diagnostic Neuroimaging

## 2021-06-08 ENCOUNTER — Encounter: Payer: Self-pay | Admitting: Diagnostic Neuroimaging

## 2021-06-08 DIAGNOSIS — G40909 Epilepsy, unspecified, not intractable, without status epilepticus: Secondary | ICD-10-CM

## 2021-06-08 DIAGNOSIS — G4719 Other hypersomnia: Secondary | ICD-10-CM

## 2021-06-08 DIAGNOSIS — R4689 Other symptoms and signs involving appearance and behavior: Secondary | ICD-10-CM

## 2021-06-08 MED ORDER — LEVETIRACETAM 750 MG PO TABS: 1500.0000 mg | ORAL_TABLET | Freq: Two times a day (BID) | ORAL | 6 refills | Status: DC

## 2021-06-08 NOTE — Patient Instructions (Signed)
  SEIZURE DISORDER (right temporal lobe localization) - increase levetiracetam to 1500mg  twice a day  - may consider other medications in future such as carbamazepine or lamotrigine  EXCESSIVE DAYTIME SLEEPINESS / FATIGUE / OBESITY - check sleep study eval for OSA; consider parasomnia eval (sudden sleepiness, abnl spells)

## 2021-06-08 NOTE — Progress Notes (Signed)
Virtual Visit via Telephone Note  I connected withNAME@ on 06/08/21 at  1:00 PM EDT by telephone and verified that I am speaking with the correct person using two identifiers.   I discussed the limitations, risks, security and privacy concerns of performing an evaluation and management service by telephone and the availability of in person appointments. I also discussed with the patient that there may be a patient responsible charge related to this service. The patient expressed understanding and agreed to proceed. Patient is at home and I am at the office.    History of Present Illness:  UPDATE (06/08/21): 30 year old female here for evaluation of seizures.  Patient continues to have at least 1-2 episodes per week.  She describes episodes of repetitive hand movements, memory lapse, zoning out, lasting for few minutes at a time.  She has been taking levetiracetam 1000 mg twice a day but symptoms have been continuing.  She has been having some intermittent fatigue a few times a week where she feels like she needs to lay down to go to sleep.   PRIOR HPI (03/02/21): 30 year old female here for evaluation of seizure-like activity.   02/28/2019 patient was sitting down outside all of a sudden she blacked out, had abnormal movements of her eyes, abnormal movements of her hand lasting for few minutes.  Her eyes stayed open.  She is not able to talk or respond.  After while she was able to respond and talk.   3 weeks prior patient had another similar event.   Patient has had some issues with academics when she was younger requiring an IEP.  Her family always felt that she had "ADD" tendencies.  Also has had history of migraines.   No family history of seizures.     Observations/Objective:  Telephone visit.   03/17/2021 MRI brain - normal MRI of the brain with and without contrast.  03/15/21 EEG in the awake and drowsy states demonstrating: - Rare sharp waves noted in right anterior  temporal region (F8, T8) are potentially epileptiform discharges. - Otherwise normal background rhythms.    Assessment and Plan:  Dx:  1. Spell of abnormal behavior   2. Seizure disorder (HCC)     SEIZURE DISORDER (right temporal lobe localization) - increase levetiracetam to 1500mg  twice a day  - may consider other medications in future such as carbamazepine or lamotrigine  EXCESSIVE DAYTIME SLEEPINESS / FATIGUE / OBESITY - check sleep study eval for OSA; consider parasomnia eval (sudden sleepiness, abnl spells)  Meds ordered this encounter  Medications   levETIRAcetam (KEPPRA) 750 MG tablet    Sig: Take 2 tablets (1,500 mg total) by mouth 2 (two) times daily.    Dispense:  120 tablet    Refill:  6   Orders Placed This Encounter  Procedures   Ambulatory referral to Sleep Studies    Follow Up Instructions:  - Return in about 6 months (around 12/09/2021).    I discussed the assessment and treatment plan with the patient. The patient was provided an opportunity to ask questions and all were answered. The patient agreed with the plan and demonstrated an understanding of the instructions.   The patient was advised to call back or seek an in-person evaluation if the symptoms worsen or if the condition fails to improve as anticipated.  I provided 15 minutes of non-face-to-face time during this encounter.    12/11/2021, MD 06/08/2021, 2:38 PM Certified in Neurology, Neurophysiology and Neuroimaging  [facility]  [place], [place], [place] [address] [phone]

## 2021-08-31 ENCOUNTER — Ambulatory Visit: Payer: Self-pay | Admitting: Diagnostic Neuroimaging

## 2021-11-27 ENCOUNTER — Other Ambulatory Visit: Payer: Self-pay | Admitting: Diagnostic Neuroimaging

## 2022-04-05 ENCOUNTER — Ambulatory Visit: Payer: Self-pay | Admitting: Dietician

## 2022-04-27 ENCOUNTER — Encounter: Payer: Self-pay | Admitting: Dietician

## 2022-04-27 NOTE — Progress Notes (Signed)
Have not heard back from patient to reschedule her missed appointment from 04/05/22. Sent notification to referring provider. 

## 2022-08-24 IMAGING — CT CT HEAD W/O CM
3 series · 15 of 47 positions shown, 18 images · non-contrast
Comparison: None.

CLINICAL DATA: Syncopal episode

EXAM:
CT HEAD WITHOUT CONTRAST
TECHNIQUE: Contiguous axial images were obtained from the base of the skull
through the vertex without intravenous contrast.

[Series 2: head wo · axial · 0.42mm/px · z∈[+682,+807]mm · 9 of 30 slices shown, 12 images]
[im 3/30  brain]
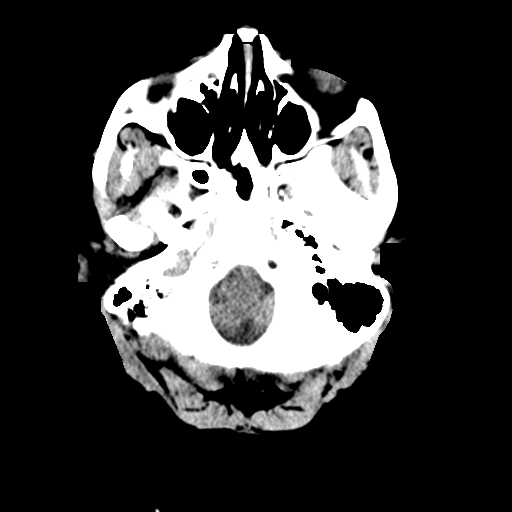
[im 3/30  bone]
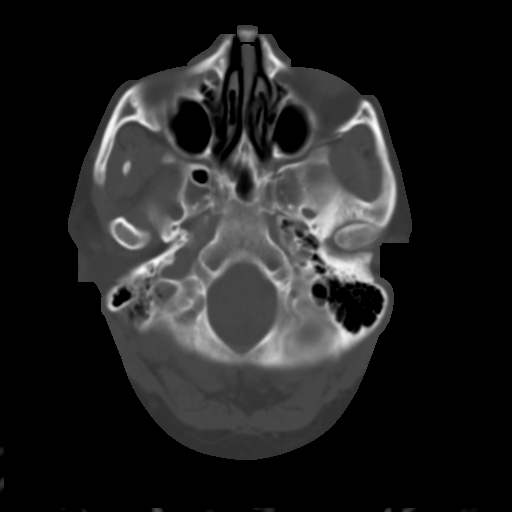
[im 6/30  brain]
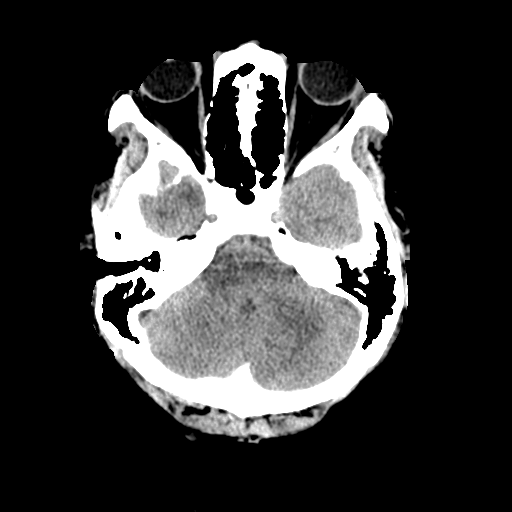
[im 9/30  brain]
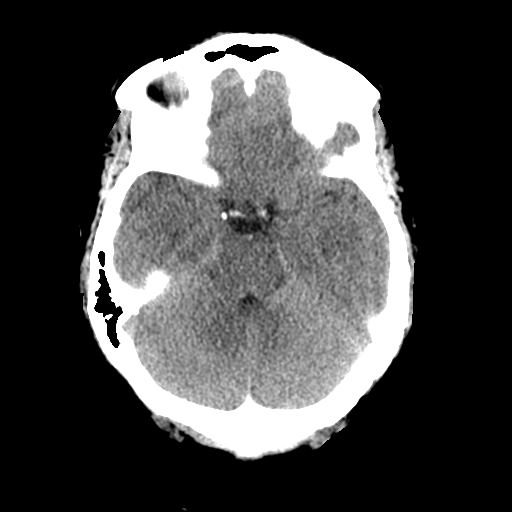
[im 12/30  brain]
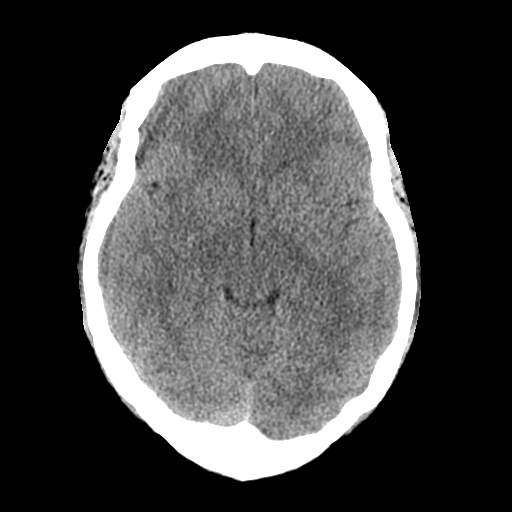
[im 16/30  brain]
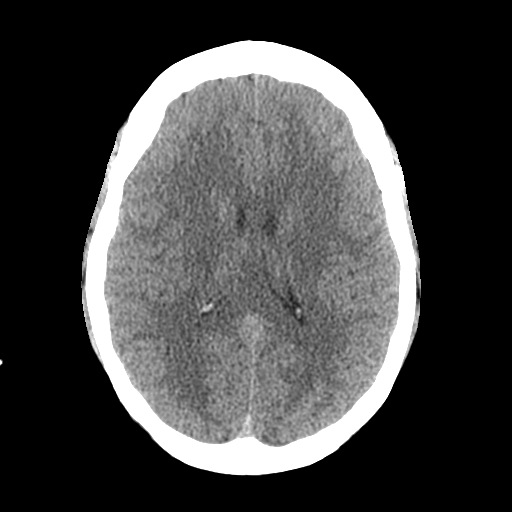
[im 16/30  bone]
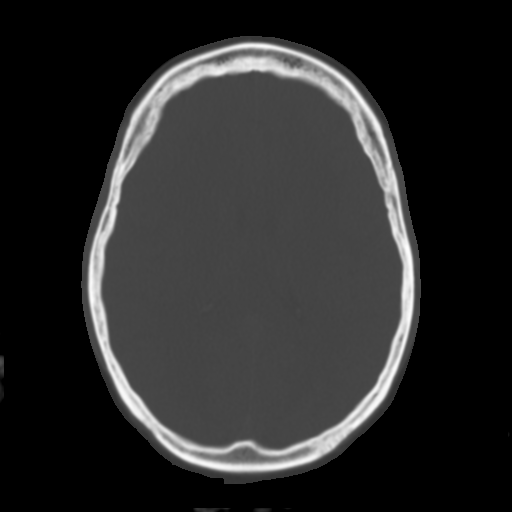
[im 19/30  brain]
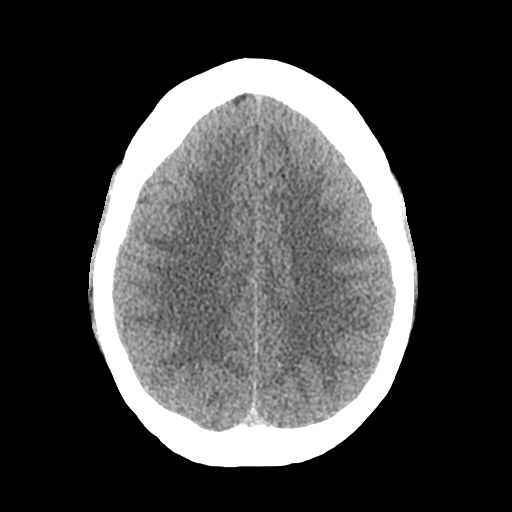
[im 22/30  brain]
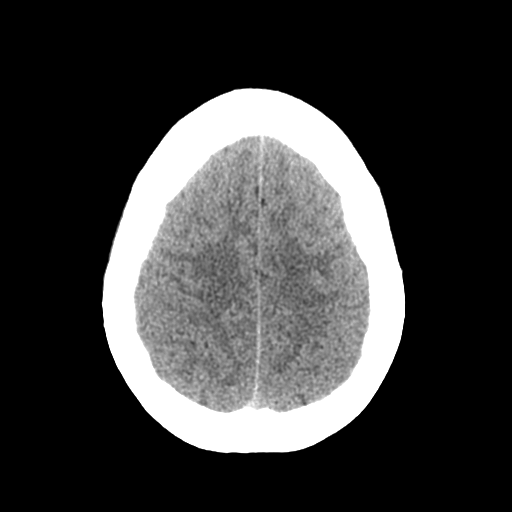
[im 25/30  brain]
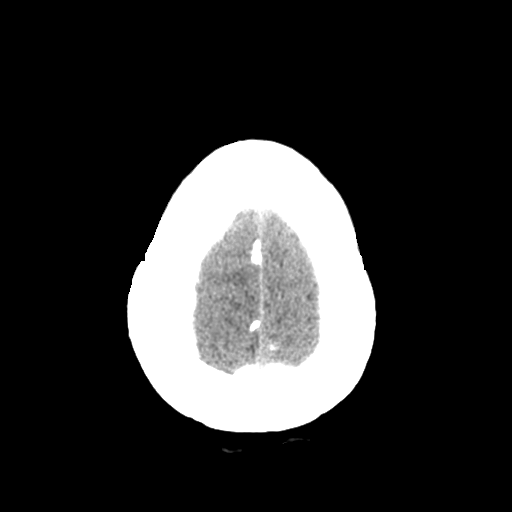
[im 28/30  brain]
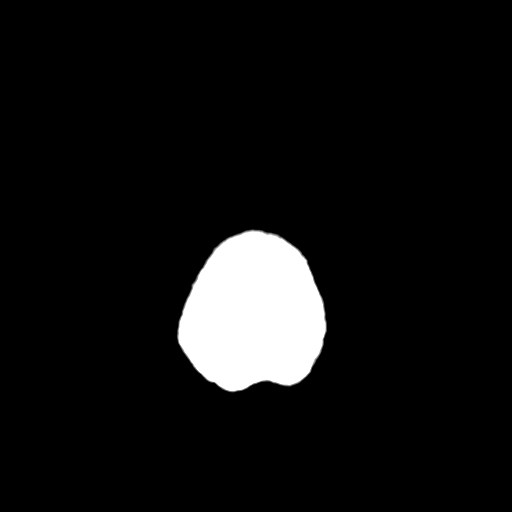
[im 28/30  bone]
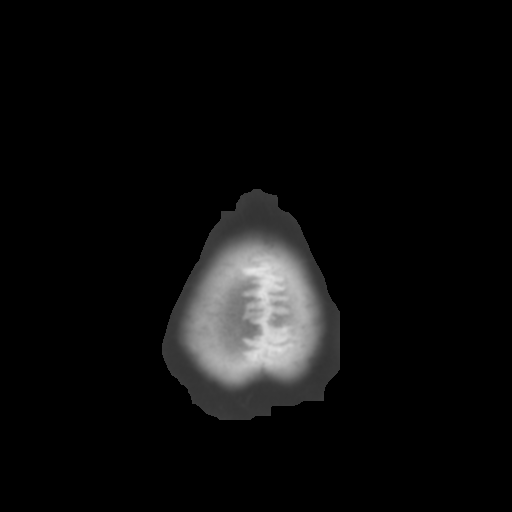

[Series 4: coronal soft tissue · coronal · 0.31mm/px · 3 of 69 slices shown]
[im 23/69  brain]
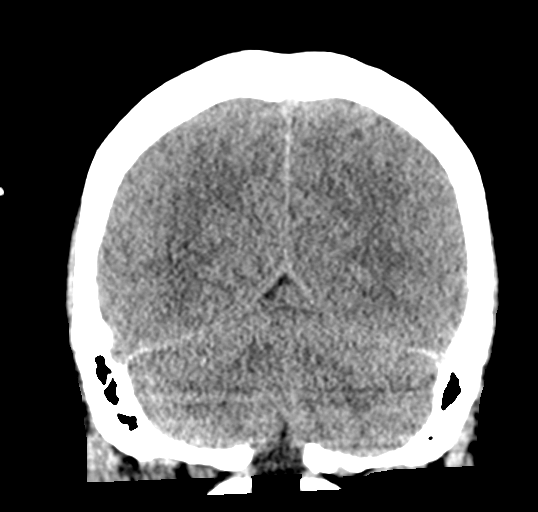
[im 31/69  brain]
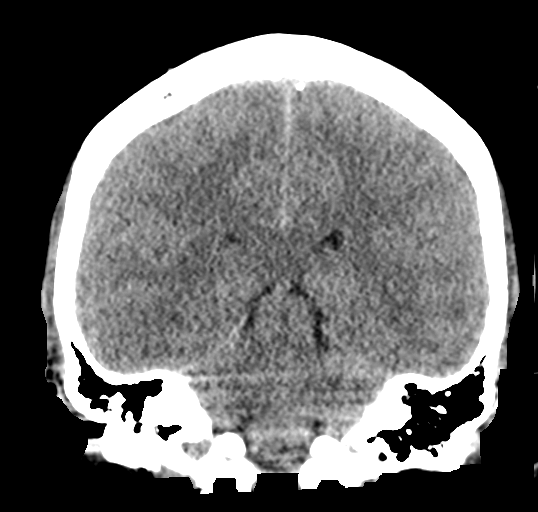
[im 38/69  brain]
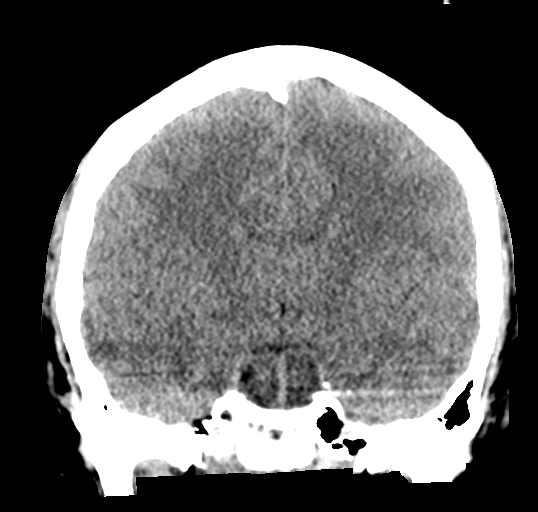

[Series 5: sagittal soft tissue · sagittal · 0.31mm/px · 3 of 56 slices shown]
[im 19/56  brain]
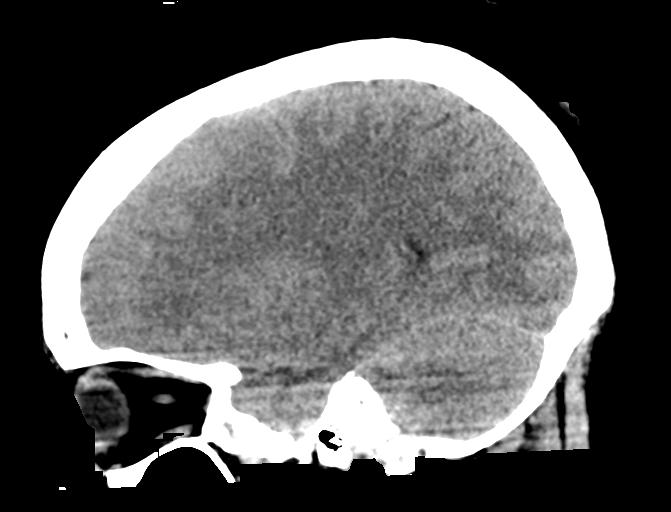
[im 28/56  brain]
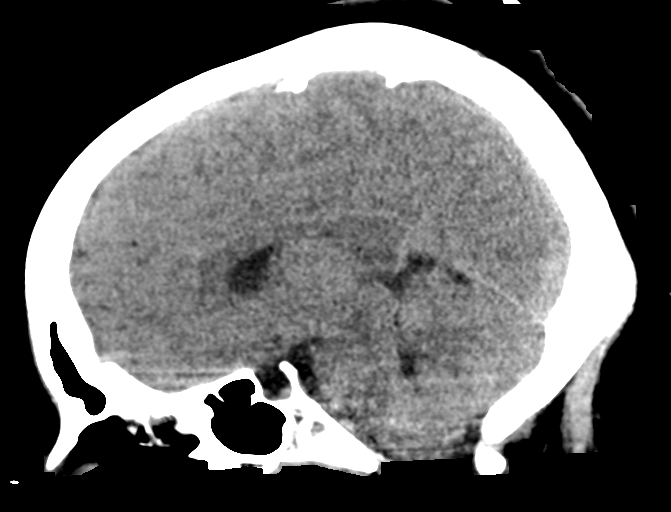
[im 37/56  brain]
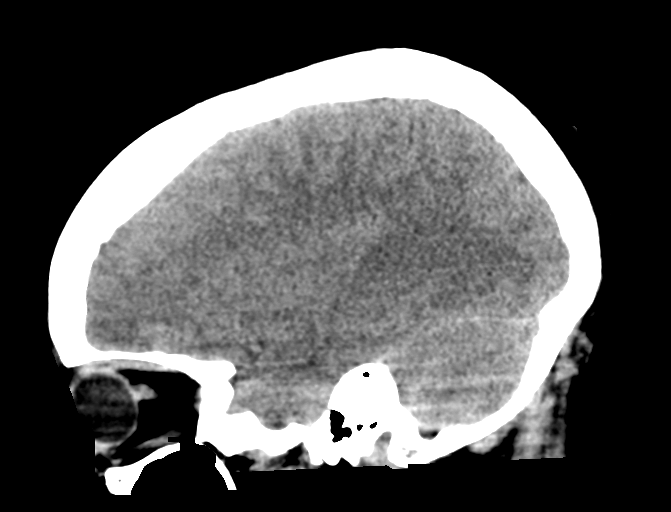

[15 of 47 positions shown; findings below may reference images not displayed]

FINDINGS: Brain: No evidence of acute infarction, hemorrhage, hydrocephalus,
extra-axial collection or mass lesion/mass effect. Dense dural
calcifications. Partially empty sella turcica.

Vascular: No hyperdense vessel or unexpected calcification.

Skull: There is a CSF density focal erosion in the greater wing of
the right sphenoid on image [DATE] with lobulated sclerotic erosion
along the posterior wall, most consistent with an aberrant arachnoid
granulation. Negative for fracture or focal lesion.

Sinuses/Orbits: Polypoid mucosal thickening of the left maxillary
sinus otherwise the paranasal sinuses are predominantly clear.

Other: Mastoid air cells are predominantly clear. Partial
pneumatization of the petrous apices. Temporal/middle cranial fossa
IMPRESSION: No acute intracranial findings.

## 2023-06-23 ENCOUNTER — Other Ambulatory Visit: Payer: Self-pay

## 2023-06-23 ENCOUNTER — Emergency Department: Payer: Managed Care, Other (non HMO) | Admitting: Anesthesiology

## 2023-06-23 ENCOUNTER — Emergency Department: Payer: Managed Care, Other (non HMO)

## 2023-06-23 ENCOUNTER — Encounter
Admission: EM | Disposition: A | Discharge status: Home / Self Care | Payer: Self-pay | Source: Home / Self Care | Attending: Student in an Organized Health Care Education/Training Program

## 2023-06-23 ENCOUNTER — Ambulatory Visit
Admission: EM | Admit: 2023-06-23 | Discharge: 2023-06-24 | Disposition: A | Discharge status: Home / Self Care | Payer: Managed Care, Other (non HMO) | Attending: Obstetrics and Gynecology | Admitting: Obstetrics and Gynecology

## 2023-06-23 DIAGNOSIS — K661 Hemoperitoneum: Secondary | ICD-10-CM | POA: Insufficient documentation

## 2023-06-23 DIAGNOSIS — G40909 Epilepsy, unspecified, not intractable, without status epilepticus: Secondary | ICD-10-CM | POA: Diagnosis not present

## 2023-06-23 DIAGNOSIS — I959 Hypotension, unspecified: Secondary | ICD-10-CM

## 2023-06-23 DIAGNOSIS — R1084 Generalized abdominal pain: Secondary | ICD-10-CM

## 2023-06-23 DIAGNOSIS — O00101 Right tubal pregnancy without intrauterine pregnancy: Secondary | ICD-10-CM | POA: Diagnosis present

## 2023-06-23 DIAGNOSIS — O009 Unspecified ectopic pregnancy without intrauterine pregnancy: Secondary | ICD-10-CM | POA: Diagnosis present

## 2023-06-23 DIAGNOSIS — Z9889 Other specified postprocedural states: Secondary | ICD-10-CM

## 2023-06-23 HISTORY — PX: LAPAROSCOPIC UNILATERAL SALPINGECTOMY: SHX5934

## 2023-06-23 LAB — COMPREHENSIVE METABOLIC PANEL
ALT: 10 U/L (ref 0–44)
AST: 12 U/L — ABNORMAL LOW (ref 15–41)
Albumin: 3.5 g/dL (ref 3.5–5.0)
Alkaline Phosphatase: 39 U/L (ref 38–126)
Anion gap: 9 (ref 5–15)
BUN: 12 mg/dL (ref 6–20)
CO2: 20 mmol/L — ABNORMAL LOW (ref 22–32)
Calcium: 8.1 mg/dL — ABNORMAL LOW (ref 8.9–10.3)
Chloride: 108 mmol/L (ref 98–111)
Creatinine, Ser: 0.65 mg/dL (ref 0.44–1.00)
GFR, Estimated: >60 mL/min (ref >60)
Glucose, Bld: 142 mg/dL — ABNORMAL HIGH (ref 70–99)
Potassium: 3 mmol/L — ABNORMAL LOW (ref 3.5–5.1)
Sodium: 137 mmol/L (ref 135–145)
Total Bilirubin: 0.4 mg/dL (ref 0.3–1.2)
Total Protein: 6.3 g/dL — ABNORMAL LOW (ref 6.5–8.1)

## 2023-06-23 LAB — CBC
HCT: 32.6 % — ABNORMAL LOW (ref 36.0–46.0)
Hemoglobin: 11.1 g/dL — ABNORMAL LOW (ref 12.0–15.0)
MCH: 30.7 pg (ref 26.0–34.0)
MCHC: 34 g/dL (ref 30.0–36.0)
MCV: 90.3 fL (ref 80.0–100.0)
Platelets: 156 10*3/uL (ref 150–400)
RBC: 3.61 MIL/uL — ABNORMAL LOW (ref 3.87–5.11)
RDW: 12.5 % (ref 11.5–15.5)
WBC: 7.7 10*3/uL (ref 4.0–10.5)
nRBC: 0 % (ref 0.0–0.2)

## 2023-06-23 LAB — CBC WITH DIFFERENTIAL/PLATELET
Abs Immature Granulocytes: 0.01 10*3/uL (ref 0.00–0.07)
Basophils Absolute: 0 10*3/uL (ref 0.0–0.1)
Basophils Relative: 0 %
Eosinophils Absolute: 0 10*3/uL (ref 0.0–0.5)
Eosinophils Relative: 1 %
HCT: 31 % — ABNORMAL LOW (ref 36.0–46.0)
Hemoglobin: 10.2 g/dL — ABNORMAL LOW (ref 12.0–15.0)
Immature Granulocytes: 0 %
Lymphocytes Relative: 19 %
Lymphs Abs: 0.9 10*3/uL (ref 0.7–4.0)
MCH: 30.2 pg (ref 26.0–34.0)
MCHC: 32.9 g/dL (ref 30.0–36.0)
MCV: 91.7 fL (ref 80.0–100.0)
Monocytes Absolute: 0.4 10*3/uL (ref 0.1–1.0)
Monocytes Relative: 9 %
Neutro Abs: 3.3 10*3/uL (ref 1.7–7.7)
Neutrophils Relative %: 71 %
Platelets: 230 10*3/uL (ref 150–400)
RBC: 3.38 MIL/uL — ABNORMAL LOW (ref 3.87–5.11)
RDW: 12.2 % (ref 11.5–15.5)
WBC: 4.7 10*3/uL (ref 4.0–10.5)
nRBC: 0 % (ref 0.0–0.2)

## 2023-06-23 LAB — HCG, QUANTITATIVE, PREGNANCY: hCG, Beta Chain, Quant, S: 37224 m[IU]/mL — ABNORMAL HIGH (ref <5)

## 2023-06-23 LAB — PREPARE RBC (CROSSMATCH)

## 2023-06-23 LAB — LIPASE, BLOOD: Lipase: 31 U/L (ref 11–51)

## 2023-06-23 LAB — SAMPLE TO BLOOD BANK

## 2023-06-23 SURGERY — SALPINGECTOMY, UNILATERAL, LAPAROSCOPIC
Anesthesia: General | Site: Abdomen | Laterality: Right

## 2023-06-23 MED ORDER — FENTANYL CITRATE (PF) 100 MCG/2ML IJ SOLN
25.0000 ug | INTRAMUSCULAR | Status: DC | PRN
  Administered 2023-06-23: 50 ug via INTRAVENOUS
  Administered 2023-06-23: 25 ug via INTRAVENOUS

## 2023-06-23 MED ORDER — OXYCODONE HCL 5 MG PO TABS
5.0000 mg | ORAL_TABLET | Freq: Once | ORAL | Status: AC | PRN
  Administered 2023-06-23: 5 mg via ORAL

## 2023-06-23 MED ORDER — OXYCODONE HCL 5 MG/5ML PO SOLN: 5.0000 mg | Freq: Once | ORAL | Status: AC | PRN

## 2023-06-23 MED ORDER — KETOROLAC TROMETHAMINE 30 MG/ML IJ SOLN
INTRAMUSCULAR | Status: AC
  Filled 2023-06-23: qty 1

## 2023-06-23 MED ORDER — FENTANYL CITRATE (PF) 100 MCG/2ML IJ SOLN
INTRAMUSCULAR | Status: AC
  Filled 2023-06-23: qty 2

## 2023-06-23 MED ORDER — FENTANYL CITRATE (PF) 100 MCG/2ML IJ SOLN
INTRAMUSCULAR | Status: DC | PRN
  Administered 2023-06-23 (×2): 50 ug via INTRAVENOUS

## 2023-06-23 MED ORDER — ROCURONIUM BROMIDE 10 MG/ML (PF) SYRINGE
PREFILLED_SYRINGE | INTRAVENOUS | Status: AC
  Filled 2023-06-23: qty 10

## 2023-06-23 MED ORDER — PHENYLEPHRINE HCL (PRESSORS) 10 MG/ML IV SOLN
INTRAVENOUS | Status: DC | PRN
  Administered 2023-06-23 (×6): 160 ug via INTRAVENOUS

## 2023-06-23 MED ORDER — FENTANYL CITRATE PF 50 MCG/ML IJ SOSY
50.0000 ug | PREFILLED_SYRINGE | INTRAMUSCULAR | Status: DC | PRN
  Administered 2023-06-23 (×2): 50 ug via INTRAVENOUS
  Filled 2023-06-23 (×2): qty 1

## 2023-06-23 MED ORDER — ONDANSETRON HCL 4 MG/2ML IJ SOLN
INTRAMUSCULAR | Status: DC | PRN
  Administered 2023-06-23: 4 mg via INTRAVENOUS

## 2023-06-23 MED ORDER — ROCURONIUM BROMIDE 100 MG/10ML IV SOLN
INTRAVENOUS | Status: DC | PRN
  Administered 2023-06-23: 10 mg via INTRAVENOUS
  Administered 2023-06-23: 50 mg via INTRAVENOUS
  Administered 2023-06-23: 10 mg via INTRAVENOUS
  Administered 2023-06-23: 20 mg via INTRAVENOUS

## 2023-06-23 MED ORDER — LIDOCAINE HCL (CARDIAC) PF 100 MG/5ML IV SOSY
PREFILLED_SYRINGE | INTRAVENOUS | Status: DC | PRN
  Administered 2023-06-23: 100 mg via INTRAVENOUS

## 2023-06-23 MED ORDER — MIDAZOLAM HCL 2 MG/2ML IJ SOLN
INTRAMUSCULAR | Status: AC
  Filled 2023-06-23: qty 2

## 2023-06-23 MED ORDER — BUPIVACAINE HCL 0.5 % IJ SOLN
INTRAMUSCULAR | Status: DC | PRN
  Administered 2023-06-23: 10 mL

## 2023-06-23 MED ORDER — ONDANSETRON HCL 4 MG/2ML IJ SOLN: 4.0000 mg | Freq: Once | INTRAMUSCULAR | Status: DC | PRN

## 2023-06-23 MED ORDER — DEXAMETHASONE SODIUM PHOSPHATE 10 MG/ML IJ SOLN
INTRAMUSCULAR | Status: DC | PRN
  Administered 2023-06-23: 10 mg via INTRAVENOUS

## 2023-06-23 MED ORDER — ACETAMINOPHEN 10 MG/ML IV SOLN
INTRAVENOUS | Status: DC | PRN
  Administered 2023-06-23: 1000 mg via INTRAVENOUS

## 2023-06-23 MED ORDER — SODIUM CHLORIDE 0.9 % IV BOLUS
1000.0000 mL | Freq: Once | INTRAVENOUS | Status: AC
  Administered 2023-06-23: 1000 mL via INTRAVENOUS

## 2023-06-23 MED ORDER — LIDOCAINE HCL (PF) 2 % IJ SOLN
INTRAMUSCULAR | Status: AC
  Filled 2023-06-23: qty 5

## 2023-06-23 MED ORDER — SODIUM CHLORIDE 0.9 % IV SOLN
INTRAVENOUS | Status: DC | PRN
Start: 2023-06-23 — End: 2023-06-23

## 2023-06-23 MED ORDER — ALBUMIN HUMAN 5 % IV SOLN
INTRAVENOUS | Status: DC | PRN
Start: 2023-06-23 — End: 2023-06-23

## 2023-06-23 MED ORDER — LACTATED RINGERS IV SOLN: INTRAVENOUS | Status: DC | PRN

## 2023-06-23 MED ORDER — LEVETIRACETAM IN NACL 1000 MG/100ML IV SOLN
1000.0000 mg | Freq: Once | INTRAVENOUS | Status: AC
  Administered 2023-06-23: 1000 mg via INTRAVENOUS
  Filled 2023-06-23: qty 100

## 2023-06-23 MED ORDER — SUCCINYLCHOLINE CHLORIDE 200 MG/10ML IV SOSY
PREFILLED_SYRINGE | INTRAVENOUS | Status: DC | PRN
  Administered 2023-06-23: 120 mg via INTRAVENOUS

## 2023-06-23 MED ORDER — KETOROLAC TROMETHAMINE 30 MG/ML IJ SOLN
30.0000 mg | Freq: Three times a day (TID) | INTRAMUSCULAR | Status: DC | PRN
  Administered 2023-06-23 – 2023-06-24 (×2): 30 mg via INTRAVENOUS
  Filled 2023-06-23 (×2): qty 1

## 2023-06-23 MED ORDER — PROPOFOL 500 MG/50ML IV EMUL
INTRAVENOUS | Status: DC | PRN
Start: 2023-06-23 — End: 2023-06-23
  Administered 2023-06-23: 175 ug/kg/min via INTRAVENOUS

## 2023-06-23 MED ORDER — HEMOSTATIC AGENTS (NO CHARGE) OPTIME
TOPICAL | Status: DC | PRN
Start: 2023-06-23 — End: 2023-06-23
  Administered 2023-06-23: 1 via TOPICAL

## 2023-06-23 MED ORDER — KETOROLAC TROMETHAMINE 30 MG/ML IJ SOLN
30.0000 mg | Freq: Once | INTRAMUSCULAR | Status: AC
  Administered 2023-06-23: 30 mg via INTRAVENOUS

## 2023-06-23 MED ORDER — ACETAMINOPHEN 10 MG/ML IV SOLN: 1000.0000 mg | Freq: Once | INTRAVENOUS | Status: DC | PRN

## 2023-06-23 MED ORDER — HYDROCODONE-ACETAMINOPHEN 5-325 MG PO TABS: 1.0000 | ORAL_TABLET | ORAL | Status: DC | PRN

## 2023-06-23 MED ORDER — BUPIVACAINE HCL (PF) 0.5 % IJ SOLN
INTRAMUSCULAR | Status: AC
  Filled 2023-06-23: qty 30

## 2023-06-23 MED ORDER — 0.9 % SODIUM CHLORIDE (POUR BTL) OPTIME
TOPICAL | Status: DC | PRN
  Administered 2023-06-23: 500 mL

## 2023-06-23 MED ORDER — MIDAZOLAM HCL 2 MG/2ML IJ SOLN
INTRAMUSCULAR | Status: DC | PRN
  Administered 2023-06-23: 2 mg via INTRAVENOUS

## 2023-06-23 MED ORDER — SUGAMMADEX SODIUM 200 MG/2ML IV SOLN
INTRAVENOUS | Status: DC | PRN
  Administered 2023-06-23: 508 mg via INTRAVENOUS

## 2023-06-23 MED ORDER — OXYCODONE HCL 5 MG PO TABS
ORAL_TABLET | ORAL | Status: AC
  Filled 2023-06-23: qty 1

## 2023-06-23 MED ORDER — PROPOFOL 10 MG/ML IV BOLUS
INTRAVENOUS | Status: DC | PRN
  Administered 2023-06-23: 180 mg via INTRAVENOUS

## 2023-06-23 MED ORDER — SEVOFLURANE IN SOLN
RESPIRATORY_TRACT | Status: AC
  Filled 2023-06-23: qty 250

## 2023-06-23 MED ORDER — ACETAMINOPHEN 10 MG/ML IV SOLN
INTRAVENOUS | Status: AC
  Filled 2023-06-23: qty 100

## 2023-06-23 MED ORDER — DEXAMETHASONE SODIUM PHOSPHATE 10 MG/ML IJ SOLN
INTRAMUSCULAR | Status: AC
  Filled 2023-06-23: qty 1

## 2023-06-23 MED ORDER — PROPOFOL 10 MG/ML IV BOLUS
INTRAVENOUS | Status: AC
  Filled 2023-06-23: qty 20

## 2023-06-23 MED ORDER — LACOSAMIDE 200 MG PO TABS
200.0000 mg | ORAL_TABLET | Freq: Once | ORAL | Status: AC
  Administered 2023-06-23: 200 mg via ORAL
  Filled 2023-06-23: qty 1

## 2023-06-23 MED ORDER — ONDANSETRON HCL 4 MG/2ML IJ SOLN
INTRAMUSCULAR | Status: AC
  Filled 2023-06-23: qty 2

## 2023-06-23 MED ORDER — ALBUMIN HUMAN 5 % IV SOLN
INTRAVENOUS | Status: AC
  Filled 2023-06-23: qty 500

## 2023-06-23 MED ORDER — OXYCODONE-ACETAMINOPHEN 5-325 MG PO TABS
1.0000 | ORAL_TABLET | ORAL | Status: DC | PRN
  Administered 2023-06-23 – 2023-06-24 (×3): 1 via ORAL
  Filled 2023-06-23 (×3): qty 1

## 2023-06-23 MED ORDER — SUCCINYLCHOLINE CHLORIDE 200 MG/10ML IV SOSY
PREFILLED_SYRINGE | INTRAVENOUS | Status: AC
  Filled 2023-06-23: qty 10

## 2023-06-23 MED ORDER — ONDANSETRON 4 MG PO TBDP: 4.0000 mg | ORAL_TABLET | Freq: Four times a day (QID) | ORAL | Status: DC | PRN

## 2023-06-23 MED ORDER — SIMETHICONE 80 MG PO CHEW
80.0000 mg | CHEWABLE_TABLET | Freq: Four times a day (QID) | ORAL | Status: DC | PRN
  Administered 2023-06-23 – 2023-06-24 (×3): 80 mg via ORAL
  Filled 2023-06-23 (×3): qty 1

## 2023-06-23 MED ORDER — ZONISAMIDE 100 MG PO CAPS
200.0000 mg | ORAL_CAPSULE | Freq: Once | ORAL | Status: AC
  Administered 2023-06-23: 200 mg via ORAL
  Filled 2023-06-23 (×2): qty 2

## 2023-06-23 MED ORDER — SODIUM CHLORIDE 0.9 % IV SOLN
1125.0000 mg | Freq: Once | INTRAVENOUS | Status: DC
  Filled 2023-06-23: qty 11.3

## 2023-06-23 SURGICAL SUPPLY — 58 items
APL PRP STRL LF DISP 70% ISPRP (MISCELLANEOUS) ×1
APL SRG 38 LTWT LNG FL B (MISCELLANEOUS) ×2
APPLICATOR ARISTA FLEXITIP XL (MISCELLANEOUS) IMPLANT
BAG DRN RND TRDRP ANRFLXCHMBR (UROLOGICAL SUPPLIES) ×1
BAG URINE DRAIN 2000ML AR STRL (UROLOGICAL SUPPLIES) ×1 IMPLANT
BLADE SURG SZ11 CARB STEEL (BLADE) ×1 IMPLANT
CATH FOLEY 2WAY 5CC 16FR (CATHETERS) ×1
CATH ROBINSON RED A/P 16FR (CATHETERS) ×1 IMPLANT
CATH URTH 16FR FL 2W BLN LF (CATHETERS) ×1 IMPLANT
CHLORAPREP W/TINT 26 (MISCELLANEOUS) ×1 IMPLANT
DRSG TEGADERM 2-3/8X2-3/4 SM (GAUZE/BANDAGES/DRESSINGS) ×1 IMPLANT
ELECT REM PT RETURN 9FT ADLT (ELECTROSURGICAL) ×1
ELECTRODE REM PT RTRN 9FT ADLT (ELECTROSURGICAL) IMPLANT
GAUZE 4X4 16PLY ~~LOC~~+RFID DBL (SPONGE) ×1 IMPLANT
GAUZE SPONGE 2X2 STRL 8-PLY (GAUZE/BANDAGES/DRESSINGS) ×1 IMPLANT
GLOVE BIOGEL PI IND STRL 7.5 (GLOVE) ×1 IMPLANT
GLOVE SURG SYN 7.0 (GLOVE) ×1 IMPLANT
GLOVE SURG SYN 7.0 PF PI (GLOVE) ×1 IMPLANT
GLOVE SURG SYN 8.0 (GLOVE) ×2 IMPLANT
GLOVE SURG SYN 8.0 PF PI (GLOVE) ×2 IMPLANT
GOWN STRL REUS W/ TWL LRG LVL3 (GOWN DISPOSABLE) ×1 IMPLANT
GOWN STRL REUS W/ TWL XL LVL3 (GOWN DISPOSABLE) ×2 IMPLANT
GOWN STRL REUS W/TWL LRG LVL3 (GOWN DISPOSABLE) ×1
GOWN STRL REUS W/TWL XL LVL3 (GOWN DISPOSABLE) ×2
GRASPER SUT TROCAR 14GX15 (MISCELLANEOUS) ×1 IMPLANT
HEMOSTAT ARISTA ABSORB 3G PWDR (HEMOSTASIS) IMPLANT
IRRIGATION STRYKERFLOW (MISCELLANEOUS) ×1 IMPLANT
IRRIGATOR STRYKERFLOW (MISCELLANEOUS) ×1
IV NS 1000ML (IV SOLUTION) ×1
IV NS 1000ML BAXH (IV SOLUTION) ×1 IMPLANT
KIT PINK PAD W/HEAD ARE REST (MISCELLANEOUS) ×1 IMPLANT
KIT PINK PAD W/HEAD ARM REST (MISCELLANEOUS) ×1 IMPLANT
KIT TURNOVER CYSTO (KITS) ×1 IMPLANT
KITTNER LAPARASCOPIC 5X40 (MISCELLANEOUS) IMPLANT
LABEL OR SOLS (LABEL) ×1 IMPLANT
MANIFOLD NEPTUNE II (INSTRUMENTS) ×1 IMPLANT
NS IRRIG 500ML POUR BTL (IV SOLUTION) ×1 IMPLANT
PACK GYN LAPAROSCOPIC (MISCELLANEOUS) ×1 IMPLANT
PAD OB MATERNITY 4.3X12.25 (PERSONAL CARE ITEMS) ×1 IMPLANT
PAD PREP OB/GYN DISP 24X41 (PERSONAL CARE ITEMS) ×1 IMPLANT
SCISSORS METZENBAUM CVD 33 (INSTRUMENTS) IMPLANT
SCRUB CHG 4% DYNA-HEX 4OZ (MISCELLANEOUS) ×1 IMPLANT
SET TUBE SMOKE EVAC HIGH FLOW (TUBING) ×1 IMPLANT
SHEARS HARMONIC ACE PLUS 36CM (ENDOMECHANICALS) ×1 IMPLANT
SLEEVE Z-THREAD 5X100MM (TROCAR) ×1 IMPLANT
STRIP CLOSURE SKIN 1/4X4 (GAUZE/BANDAGES/DRESSINGS) ×1 IMPLANT
SUT VIC AB 0 CT1 36 (SUTURE) ×1 IMPLANT
SUT VIC AB 2-0 UR6 27 (SUTURE) ×1 IMPLANT
SUT VIC AB 4-0 SH 27 (SUTURE)
SUT VIC AB 4-0 SH 27XANBCTRL (SUTURE) ×1 IMPLANT
SWABSTK COMLB BENZOIN TINCTURE (MISCELLANEOUS) ×1 IMPLANT
SYR 50ML LL SCALE MARK (SYRINGE) IMPLANT
SYS BAG RETRIEVAL 10MM (BASKET) ×1
SYSTEM BAG RETRIEVAL 10MM (BASKET) ×1 IMPLANT
TRAP FLUID SMOKE EVACUATOR (MISCELLANEOUS) ×1 IMPLANT
TROCAR Z-THRD FIOS HNDL 11X100 (TROCAR) ×1 IMPLANT
TROCAR Z-THREAD FIOS 5X100MM (TROCAR) ×1 IMPLANT
WATER STERILE IRR 500ML POUR (IV SOLUTION) ×1 IMPLANT

## 2023-06-23 NOTE — ED Provider Notes (Signed)
[facility] Provider Note    Event Date/Time   First MD Initiated Contact with Patient 06/23/23 [phone]     (approximate)   History   Abdominal Pain   HPI  CERIA GARING is a 32 y.o. female with a past medical history of obesity and epilepsy presents today for evaluation of abdominal pain.  Patient reports that this has been ongoing for approximately 2 weeks, but has worsened today.  She reports that the pain in her umbilicus and her right lower quadrant.  She denies any burning with urination or vaginal discharge, but reports that she has had vaginal bleeding for the past 2 weeks.  She has been pregnant 1 time and had 1 cesarean section.  No fevers or chills.  She is nauseated and had 1 episode of vomiting last week though none since.  No other abdominal surgeries.  Patient Active Problem List   Diagnosis Date Noted   Obesity BMI=44.7 03/24/2020   Left-sided Bell's palsy 05/06/2018          Physical Exam   Triage Vital Signs: ED Triage Vitals  Encounter Vitals Group     BP      Systolic BP Percentile      Diastolic BP Percentile      Pulse      Resp      Temp      Temp src      SpO2      Weight      Height      Head Circumference      Peak Flow      Pain Score      Pain Loc      Pain Education      Exclude from Growth Chart     Most recent vital signs: Vitals:   06/23/23 0727  BP: (!) 68/49  Pulse: 76  Resp: 18  Temp: 97.8 F (36.6 C)  SpO2: 100%    Physical Exam Vitals and nursing note reviewed.  Constitutional:      General: Awake and alert. Uncomfortable appearing    Appearance: Normal appearance. The patient is obese.  HENT:     Head: Normocephalic and atraumatic.     Mouth: Mucous membranes are moist.  Eyes:     General: PERRL. Normal EOMs        Right eye: No discharge.        Left eye: No discharge.     Conjunctiva/sclera: Conjunctivae normal.  Cardiovascular:     Rate and Rhythm: Normal rate and regular  rhythm.     Pulses: Normal pulses.  Pulmonary:     Effort: Pulmonary effort is normal. No respiratory distress.     Breath sounds: Normal breath sounds.  Abdominal:     Abdomen is soft. There is diffuse abdominal tenderness worse periumbilically and LLQ and RLQ. Mild voluntary guarding present. No distention. Musculoskeletal:        General: No swelling. Normal range of motion.     Cervical back: Normal range of motion and neck supple.  Skin:    General: Skin is warm and dry.     Capillary Refill: Capillary refill takes less than 2 seconds.     Findings: No rash.  Neurological:     Mental Status: The patient is awake and alert.      ED Results / Procedures / Treatments   Labs (all labs ordered are listed, but only abnormal results are displayed) Labs Reviewed  CBC  WITH DIFFERENTIAL/PLATELET - Abnormal; Notable for the following components:      Result Value   RBC 3.38 (*)    Hemoglobin 10.2 (*)    HCT 31.0 (*)    All other components within normal limits  COMPREHENSIVE METABOLIC PANEL  LIPASE, BLOOD  URINALYSIS, W/ REFLEX TO CULTURE (INFECTION SUSPECTED)  HCG, QUANTITATIVE, PREGNANCY  POC URINE PREG, ED     EKG     RADIOLOGY     PROCEDURES:  Critical Care performed:   Procedures   MEDICATIONS ORDERED IN ED: Medications  fentaNYL (SUBLIMAZE) injection 50 mcg (has no administration in time range)  sodium chloride 0.9 % bolus 1,000 mL (has no administration in time range)     IMPRESSION / MDM / ASSESSMENT AND PLAN / ED COURSE  I reviewed the triage vital signs and the nursing notes.   Differential diagnosis includes, but is not limited to, appendicitis, ectopic pregnancy, ruptured ectopic, TOA, abscess, perforated viscus, ovarian cyst, ovarian torsion, ruptured cyst, blood loss anemia.  Patient is awake and alert, answering questions appropriately, though her blood pressure was 68/49 taken several times by the RN at the bedside.  She is not  tachycardic, she is afebrile, and she has a normal oxygen saturation of 100% on room air.  She grimaces anytime she moves. Placed on the cardiac monitor.  IV was established and blood work and urinalysis with urine pregnancy ordered.  Plan for abdominal imaging once pregnancy status confirmed.  Plan for pain control once pregnancy status confirmed.  Given her hypotension, patient was moved to the main side of the emergency department and passed off to Dr. Roxan Hockey pending full workup and final disposition.   Patient's presentation is most consistent with acute presentation with potential threat to life or bodily function.      FINAL CLINICAL IMPRESSION(S) / ED DIAGNOSES   Final diagnoses:  Generalized abdominal pain  Hypotension, unspecified hypotension type     Rx / DC Orders   ED Discharge Orders     None        Note:  This document was prepared using Dragon voice recognition software and may include unintentional dictation errors.   Jackelyn Hoehn, PA-C 06/23/23 0750    Willy Eddy, MD 06/23/23 1037

## 2023-06-23 NOTE — Brief Op Note (Signed)
06/23/2023  1:34 PM  PATIENT:  Cynthia Patel  32 y.o. female  PRE-OPERATIVE DIAGNOSIS:right   ectopic pregnancy  POST-OPERATIVE DIAGNOSIS:  ectopic pregnancy Significant hemoperitoneum  PROCEDURE:  Procedure(s): LAPAROSCOPIC UNILATERAL SALPINGECTOMY (Right)  SURGEON:  Surgeons and Role:    * Jeannemarie Sawaya, Ihor Austin, MD - Primary  PHYSICIAN ASSISTANT: CST   ASSISTANTS: none   ANESTHESIA:   general  EBL: 1500 hemoperitoneum  preprocedure 100 cc intra-op = 1600 cc total  IOF 1500 cc  Uo 100 urine   BLOOD ADMINISTERED: 1  unit administer PRBC during operation   DRAINS: none  LOCAL MEDICATIONS USED:  MARCAINE     SPECIMEN:  Source of Specimen:  right fallopian tube with ectopic   DISPOSITION OF SPECIMEN:  PATHOLOGY  COUNTS:  YES  TOURNIQUET:  * No tourniquets in log *  DICTATION: .Other Dictation: Dictation Number verbal  PLAN OF CARE:  observation   PATIENT DISPOSITION:  PACU - hemodynamically stable.   Delay start of Pharmacological VTE agent (>24hrs) due to surgical blood loss or risk of bleeding: not applicable

## 2023-06-23 NOTE — ED Notes (Signed)
Called blood bank to run the type and screen for this pt.

## 2023-06-23 NOTE — Anesthesia Procedure Notes (Addendum)
Procedure Name: Intubation Date/Time: 06/23/2023 11:38 AM  Performed by: Stormy Fabian, CRNAPre-anesthesia Checklist: Patient identified, Emergency Drugs available, Suction available and Patient being monitored Patient Re-evaluated:Patient Re-evaluated prior to induction Oxygen Delivery Method: Circle system utilized Preoxygenation: Pre-oxygenation with 100% oxygen Induction Type: IV induction, Cricoid Pressure applied and Rapid sequence Laryngoscope Size: Mac, 3 and McGraph Grade View: Grade II Tube type: Oral Tube size: 6.5 mm Number of attempts: 1 Airway Equipment and Method: Stylet Placement Confirmation: ETT inserted through vocal cords under direct vision, positive ETCO2 and breath sounds checked- equal and bilateral Secured at: 21 cm Tube secured with: Tape Dental Injury: Teeth and Oropharynx as per pre-operative assessment

## 2023-06-23 NOTE — Anesthesia Preprocedure Evaluation (Signed)
Anesthesia Evaluation  Patient identified by MRN, date of birth, ID band Patient awake    Reviewed: Allergy & Precautions, NPO status , Patient's Chart, lab work & pertinent test results  History of Anesthesia Complications Negative for: history of anesthetic complications  Airway Mallampati: II  TM Distance: >3 FB Neck ROM: Full    Dental no notable dental hx. (+) Teeth Intact   Pulmonary neg pulmonary ROS, neg sleep apnea, neg COPD, Patient abstained from smoking.Not current smoker   Pulmonary exam normal breath sounds clear to auscultation       Cardiovascular Exercise Tolerance: Good METS(-) hypertension(-) CAD and (-) Past MI negative cardio ROS (-) dysrhythmias  Rhythm:Regular Rate:Normal - Systolic murmurs    Neuro/Psych Seizures -, Well Controlled,  No seizures in a year  negative psych ROS   GI/Hepatic ,neg GERD  ,,(+)     (-) substance abuse    Endo/Other  neg diabetes  Morbid obesity  Renal/GU negative Renal ROS     Musculoskeletal   Abdominal  (+) + obese Abdomen: tender.   Peds  Hematology   Anesthesia Other Findings Past Medical History: No date: Bell's palsy  Reproductive/Obstetrics                             Anesthesia Physical Anesthesia Plan  ASA: 3  Anesthesia Plan: General   Post-op Pain Management: Ofirmev IV (intra-op)* and Toradol IV (intra-op)*   Induction: Intravenous and Rapid sequence  PONV Risk Score and Plan: 4 or greater and Ondansetron, Dexamethasone, Midazolam, TIVA and Propofol infusion  Airway Management Planned: Oral ETT and Video Laryngoscope Planned  Additional Equipment: None  Intra-op Plan:   Post-operative Plan: Extubation in OR  Informed Consent: I have reviewed the patients History and Physical, chart, labs and discussed the procedure including the risks, benefits and alternatives for the proposed anesthesia with the patient or  authorized representative who has indicated his/her understanding and acceptance.     Dental advisory given  Plan Discussed with: CRNA and Surgeon  Anesthesia Plan Comments: (Patient NPO appropriate. Denies N/V today but has exquisite abdominal pain.  Discussed risks of anesthesia with patient, including PONV, sore throat, lip/dental/eye damage. Rare risks discussed as well, such as cardiorespiratory and neurological sequelae, and allergic reactions. Discussed the role of CRNA in patient's perioperative care. Patient understands.  Patient informed about increased incidence of above perioperative risk due to high BMI. Patient understands.  )       Anesthesia Quick Evaluation

## 2023-06-23 NOTE — ED Triage Notes (Addendum)
Pt c/o abdominal pain that started two weeks ago, today pain was severe. Pt has had her period for two weeks.

## 2023-06-23 NOTE — ED Notes (Signed)
Called lab to ask if they had what they needed to run the HCG, and lab said that they did and they will run the test when blood bank is out of the chart.

## 2023-06-23 NOTE — Transfer of Care (Signed)
Immediate Anesthesia Transfer of Care Note  Patient: Cynthia Patel  Procedure(s) Performed: LAPAROSCOPIC UNILATERAL SALPINGECTOMY (Right: Abdomen)  Patient Location: PACU  Anesthesia Type:General  Level of Consciousness: drowsy  Airway & Oxygen Therapy: Patient Spontanous Breathing and Patient connected to face mask oxygen  Post-op Assessment: Report given to RN and Post -op Vital signs reviewed and stable  Post vital signs: Reviewed and stable  Last Vitals:  Vitals Value Taken Time  BP 126/75 06/23/23 1349  Temp 36.1 C 06/23/23 1349  Pulse 72 06/23/23 1350  Resp 17 06/23/23 1350  SpO2 100 % 06/23/23 1350  Vitals shown include unfiled device data.  Last Pain:  Vitals:   06/23/23 1349  TempSrc: Temporal  PainSc:       Patients Stated Pain Goal: 0 (06/23/23 0724)  Complications: No notable events documented.

## 2023-06-23 NOTE — Anesthesia Postprocedure Evaluation (Signed)
Anesthesia Post Note  Patient: Cynthia Patel  Procedure(s) Performed: LAPAROSCOPIC UNILATERAL SALPINGECTOMY (Right: Abdomen)  Patient location during evaluation: PACU Anesthesia Type: General Level of consciousness: awake and alert Pain management: pain level controlled Vital Signs Assessment: post-procedure vital signs reviewed and stable Respiratory status: spontaneous breathing, nonlabored ventilation, respiratory function stable and patient connected to nasal cannula oxygen Cardiovascular status: blood pressure returned to baseline and stable Postop Assessment: no apparent nausea or vomiting Anesthetic complications: no   No notable events documented.   Last Vitals:  Vitals:   06/23/23 1445 06/23/23 1515  BP: (!) 133/98 (!) 133/98  Pulse: 68 78  Resp: 19 (!) 26  Temp:  (!) 36.1 C  SpO2: 99% 100%    Last Pain:  Vitals:   06/23/23 1515  TempSrc:   PainSc: 4                  Corinda Gubler

## 2023-06-23 NOTE — Op Note (Unsigned)
NAME: Cynthia Patel, Cynthia A. MEDICAL RECORD NO: [id number] ACCOUNT NO: [id number] DATE OF BIRTH: [birth year] FACILITY: [facility] LOCATION: [facility] PHYSICIAN: Suzy Bouchard, MD  Operative Report   DATE OF PROCEDURE: 06/23/2023  PREOPERATIVE DIAGNOSIS:  Right tubal ectopic pregnancy.  POSTOPERATIVE DIAGNOSES: 1.  Right tubal ectopic pregnancy ruptured. 2.  Hemoperitoneum.  PROCEDURE: 1.  Laparoscopic right salpingectomy. 2.  Evacuation of hemoperitoneum.  SURGEON:  Suzy Bouchard, MD  FIRST ASSISTANT:  Certified scrub tech.  ANESTHESIA:  General endotracheal anesthesia.  INDICATIONS:  This is a 32 year old gravida 2, para 1, patient with a 2-week history of vague abdominal pain, presented to the emergency department the day of the procedure with increasing right lower quadrant pain.  The patient was noted to be  hypotensive in the emergency department and quantitative hCG of 37,000 and an ultrasound that demonstrated a 6 cm right adnexal mass consistent with ectopic pregnancy.  DESCRIPTION OF PROCEDURE:  After adequate general endotracheal anesthesia, the patient was placed in dorsal supine position with legs in the [place] stirrups.  The patient's abdomen, perineum and vagina were prepped and draped in normal sterile fashion.   Timeout was performed.  Straight catheterization of the bladder yielded 100 mL concentrated urine and a double sponge stick was placed into the vagina to be used for uterine manipulation during the procedure.  Gloves and gown were changed.  Attention was  directed to the patient's abdomen where a 5 mm infraumbilical incision was made after injecting with 0.5% Marcaine.  The 5 mm laparoscope was advanced into the abdominal cavity under direct visualization with the Optiview cannula.  Initial impression  revealed significant hemoperitoneum with obscuring of the uterus and adnexal structures.  A second port placement was placed in left lower quadrant, 3 cm  medial to the left anterior iliac spine, an 11 mm trocar was advanced under direct visualization.   Suction irrigator was brought up to the field and significant hemoperitoneum was evacuated.  Third port placement was placed in right lower quadrant, 3 cm medial to the right anterior iliac spine, a 5 mm trocar was advanced.  1500 mL hemoperitoneum was  ultimately evacuated during the procedure.  The right adnexa demonstrated a distended right fallopian tube with evidence of rupture, proximal portion of the fallopian tube.  Harmonic scalpel was brought up to the operative field and mesosalpinx and the  ectopic pregnancy were dissected free.  Good hemostasis was noted on the pedicle and of the right ovary.  Ectopic pregnancy was placed into the Endobag and retrieved through the left lower quadrant port site.  The pedicle again appeared hemostatic and  pressure was lowered to 7 mmHg with no additional bleeding.  Arista was then applied to the right adnexal pedicle and the procedure was terminated.  The Carter-Thomason cone apparatus was placed into the left lower port site and the fascia was closed  with 0 Vicryl suture.  Good closure of the fascial site and the patient's abdomen was deflated.  All instruments were removed and the skin was reapproximated with interrupted 4-0 Vicryl suture.  The patient did receive 2 units of packed red blood cells  given the amount of blood that was in the abdomen at the time of surgery.  She did demonstrate to transient hypotension before the blood was given.  It was stable, going to the recovery room.  ESTIMATED BLOOD LOSS:  1500 mL hemoperitoneum with 100 mL intraoperative blood loss, totaling 1600 mL.  URINE OUTPUT:  100 mL  INTRAOPERATIVE FLUIDS:  1500 mL.  The patient did receive 2 units of packed red blood cells.  No complications.  She was taken to recovery room in good condition.   SHW D: 06/23/2023 1:59:32 pm T: 06/23/2023 8:43:00 pm  JOB: [id number]/  [id number]

## 2023-06-23 NOTE — H&P (Signed)
Consult History and Physical   SERVICE: Gynecology unassigned Gyn .  Patient Name: Cynthia Patel Patient MRN:   [id number]  CC:abd pain   HPI: Cynthia Patel is a 33 y.o. G1P1001 with irregular cycles and 2 week h/o vague abd pain . Today pain worsened and right sided localization  Hypotension in ED  G2P1  prior c/s . BHCG 37K ,  U/s shows rt cm adnexal mass and empty UTX  Review of Systems: positives in bold GEN:   fevers, chills, weight changes, appetite changes, fatigue, night sweats HEENT:  HA, vision changes, hearing loss, congestion, rhinorrhea, sinus pressure, dysphagia CV:   CP, palpitations PULM:  SOB, cough GI:  +abd pain, N/V/D/C GU:  dysuria, urgency, frequency MSK:  arthralgias, myalgias, back pain, swelling SKIN:  rashes, color changes, pallor NEURO:  numbness, weakness, tingling, seizures, dizziness, tremors PSYCH:  depression, anxiety, behavioral problems, confusion  HEME/LYMPH:  easy bruising or bleeding ENDO:  heat/cold intolerance  Past Obstetrical History: OB History     Gravida  1   Para  1   Term  1   Preterm  0   AB  0   Living  1      SAB  0   IAB  0   Ectopic  0   Multiple  0   Live Births  1           Past Gynecologic History: No PID , STI  Past Medical History: Past Medical History:  Diagnosis Date   Bell's palsy   epilepsy  Past Surgical History:   Past Surgical History:  Procedure Laterality Date   CESAREAN SECTION N/A 02/23/2013   Procedure: Primary cesarean section with delivery of baby boy at 36. Apgars 8/9.;  Surgeon: Reva Bores, MD;  Location: [facility];  Service: Obstetrics;  Laterality: N/A;    Family History:  family history includes Diabetes in her father; Heart disease in her paternal grandmother; Hyperlipidemia in her maternal grandmother; Hypertension in her father, maternal grandmother, and mother; Kidney disease in her father; Migraines in her maternal grandmother.  Social History:   Social History   Socioeconomic History   Marital status: Single    Spouse name: Not on file   Number of children: 1   Years of education: Not on file   Highest education level: Associate degree: occupational, Scientist, product/process development, or vocational program  Occupational History    Comment: [employer] L[employer]  Tobacco Use   Smoking status: Never   Smokeless tobacco: Never  Substance and Sexual Activity   Alcohol use: Yes    Comment: occ   Drug use: No   Sexual activity: Yes    Birth control/protection: Condom, Implant  Other Topics Concern   Not on file  Social History Narrative   Lives home with family.  Has one child.  Works as Public house manager at [employer].  Education: certification.     Caffeine mountain dew every other day.   Social Determinants of Health   Financial Resource Strain: Patient Declined (05/16/2023)   Received from [facility]   Overall Financial Resource Strain (CARDIA)    Difficulty of Paying Living Expenses: Patient declined  Food Insecurity: Patient Declined (05/16/2023)   Received from [facility]   Hunger Vital Sign    Worried About Running Out of Food in the Last Year: Patient declined    Ran Out of Food in the Last Year: Patient declined  Transportation Needs: Patient Declined (  05/16/2023)   Received from [facility]   PRAPARE - Transportation    In the past 12 months, has lack of transportation kept you from medical appointments or from getting medications?: Patient declined    Lack of Transportation (Non-Medical): Patient declined  Physical Activity: Inactive (05/24/2022)   Received from [facility]   Exercise Vital Sign    Days of Exercise per Week: 0 days    Minutes of Exercise per Session: 0 min  Stress: No Stress Concern Present (05/24/2022)   Received from [facility] - Occupational Stress Questionnaire    Feeling of Stress : Not at all  Social Connections:  Socially Isolated (05/24/2022)   Received from [facility]   Social Connection and Isolation Panel [NHANES]    Frequency of Communication with Friends and Family: More than three times a week    Frequency of Social Gatherings with Friends and Family: More than three times a week    Attends Religious Services: Never    Database administrator or Organizations: No    Attends Banker Meetings: Never    Marital Status: Never married  Intimate Partner Violence: Not At Risk (05/24/2022)   Received from [facility]   Humiliation, Afraid, Rape, and Kick questionnaire    Fear of Current or Ex-Partner: No    Emotionally Abused: No    Physically Abused: No    Sexually Abused: No    Home Medications:  Medications reconciled in EPIC  No current facility-administered medications on file prior to encounter.   Current Outpatient Medications on File Prior to Encounter  Medication Sig Dispense Refill   acetaminophen (TYLENOL) 500 MG tablet Take 500 mg by mouth every 6 (six) hours as needed.     ibuprofen (ADVIL) 200 MG tablet Take 200 mg by mouth every 6 (six) hours as needed.     levETIRAcetam (KEPPRA) 750 MG tablet TAKE 2 TABLETS (1,500 MG TOTAL) BY MOUTH 2 (TWO) TIMES DAILY. 120 tablet 6    Allergies:  Allergies  Allergen Reactions   Amoxicillin Itching and Rash    Physical Exam:  Temp:  [97.8 F (36.6 C)] 97.8 F (36.6 C) (09/07 0727) Pulse Rate:  [61-86] 63 (09/07 1015) Resp:  [13-32] 14 (09/07 1015) BP: (68-111)/(41-77) 111/77 (09/07 1015) SpO2:  [99 %-100 %] 100 % (09/07 1015)   General Appearance:  Well developed, well nourished, no acute distress, alert and oriented x3 HEENT:  Normocephalic atraumatic, extraocular movements intact, moist mucous membranes Cardiovascular:  Normal S1/S2, regular rate and rhythm, no murmurs Pulmonary:  clear to auscultation, no wheezes, rales or rhonchi, symmetric air entry, good air exchange Abdomen:  Bowel sounds present, soft,  nontender, nondistended, no abnormal masses, no epigastric pain Extremities:  Full range of motion, no pedal edema, 2+ distal pulses, no tenderness Skin:  normal coloration and turgor, no rashes, no suspicious skin lesions noted  Neurologic:  Cranial nerves 2-12 grossly intact, normal muscle tone, strength 5/5 all four extremities Psychiatric:  Normal mood and affect, appropriate, no AH/VH   Labs/Studies:  BHCG 37224 BMT pending , prior report O+  CBC and Coags:  Lab Results  Component Value Date   WBC 4.7 06/23/2023   NEUTOPHILPCT 71 06/23/2023   EOSPCT 1 06/23/2023   BASOPCT 0 06/23/2023   LYMPHOPCT 19 06/23/2023   HGB 10.2 (L) 06/23/2023   HCT 31.0 (L) 06/23/2023   MCV 91.7 06/23/2023   PLT  230 06/23/2023   CMP:  Lab Results  Component Value Date   NA 137 06/23/2023   K 3.0 (L) 06/23/2023   CL 108 06/23/2023   CO2 20 (L) 06/23/2023   BUN 12 06/23/2023   CREATININE 0.65 06/23/2023   CREATININE 0.71 02/27/2021   CREATININE 0.46 (L) 02/23/2013   PROT 6.3 (L) 06/23/2023   BILITOT 0.4 06/23/2023   ALT 10 06/23/2023   AST 12 (L) 06/23/2023   ALKPHOS 39 06/23/2023    Other Imaging: US OB LESS THAN 14 WEEKS WITH OB TRANSVAGINAL  Addendum Date: 06/23/2023   ADDENDUM REPORT: 06/23/2023 10:00 ADDENDUM: Critical findings discussed by telephone with Dr. Willy Eddy on 06/23/2023 at 0946 hours. Electronically Signed   By: Odessa Fleming M.D.   On: 06/23/2023 10:00   Result Date: 06/23/2023 CLINICAL DATA:  32 year old female with pelvic pain for 1 week. Quantitative beta HCG 37,224. EXAM: OBSTETRIC <14 WK ULTRASOUND TECHNIQUE: Transabdominal ultrasound was performed for evaluation of the gestation as well as the maternal uterus and adnexal regions. COMPARISON:  None Available. FINDINGS: Intrauterine gestational sac: None. Maternal uterus/adnexae: Parke Simmers appearing uterine endometrial thickening up to 16 mm. No free fluid. However, in the right adnexa region there is a complex mixed  echogenic mass encompassing about 6 cm (series 1, image 29) with central combined cystic and soft tissue component which resembles an ectopic gestational sac with fetal pole. Furthermore, cardiac activity is suggested on M-mode Doppler (series 1, image 30), although with power Doppler the area does not appear hypervascular (image 61). This is adjacent to the uterus, likely at the level of the right tube. Neither ovary is delineated. IMPRESSION: Absent IUP and highly suspicious 6 cm mixed echogenic mass in the area of the right adnexa, fallopian tube with morphology resembling a small fetal pole. Favor ectopic pregnancy.  Recommend OBGYN consultation. Electronically Signed: By: Odessa Fleming M.D. On: 06/23/2023 09:44     Assessment / Plan:   Cynthia Patel is a 32 y.o. G1P1001 who presents with pelvic pain and u/s findings of a right ectopic pregnancy   1. Spoke to the patient about the need for surgical intervention . Ie L/S and right salpingectomy  Risks explained to the patient and consents signed  All questions answered . Seizure meds will be given to the patient before surgery  60 minutes in patient care    Thank you for the opportunity to be involved with this pt's care.

## 2023-06-24 ENCOUNTER — Encounter: Payer: Self-pay | Admitting: Obstetrics and Gynecology

## 2023-06-24 DIAGNOSIS — O00101 Right tubal pregnancy without intrauterine pregnancy: Secondary | ICD-10-CM | POA: Diagnosis not present

## 2023-06-24 LAB — TYPE AND SCREEN
ABO/RH(D): O POS
Antibody Screen: NEGATIVE
Unit division: 0
Unit division: 0

## 2023-06-24 LAB — BPAM RBC
Blood Product Expiration Date: 202410072359
Blood Product Expiration Date: 202410072359
ISSUE DATE / TIME: 202409071248
ISSUE DATE / TIME: 202409071248
Unit Type and Rh: 5100
Unit Type and Rh: 5100

## 2023-06-24 MED ORDER — IBUPROFEN 800 MG PO TABS: 800.0000 mg | ORAL_TABLET | Freq: Three times a day (TID) | ORAL | 0 refills | Status: AC | PRN

## 2023-06-24 MED ORDER — GABAPENTIN 300 MG PO CAPS: 300.0000 mg | ORAL_CAPSULE | Freq: Every day | ORAL | 0 refills | Status: AC

## 2023-06-24 MED ORDER — OXYCODONE-ACETAMINOPHEN 5-325 MG PO TABS
1.0000 | ORAL_TABLET | Freq: Four times a day (QID) | ORAL | 0 refills | Status: DC | PRN
Start: 2023-06-24 — End: 2024-01-09

## 2023-06-24 MED ORDER — ONDANSETRON HCL 4 MG PO TABS: 4.0000 mg | ORAL_TABLET | Freq: Every day | ORAL | 1 refills | Status: AC | PRN

## 2023-06-24 NOTE — Progress Notes (Signed)
Discharge instructions reviewed with patient and significant other.  Questions answered and followup care reviewed.  Printed copies given to patient for reference after discharge home. Patient transported to medical mall entrance via wheelchair to discharge home.

## 2023-06-24 NOTE — Discharge Summary (Signed)
Physician Discharge Summary  Patient ID: Cynthia Patel MRN: [id number] DOB/AGE: 1990/12/19 32 y.o.  Admit date: 06/23/2023 Discharge date: 06/24/2023  Admission Diagnoses:right tubal ectopic , ruptured 1600cc hemoperitoneum  Discharge Diagnoses: same Principal Problem:   Status post surgery Active Problems:   Ruptured ectopic pregnancy   Discharged Condition: good  Hospital Course: underwent L/S right salpingectomy and evacuation of hemoperitoneum . Received 2 units of PRBC   Consults: None  Significant Diagnostic Studies: labs:  Results for orders placed or performed during the hospital encounter of 06/23/23 (from the past 72 hour(s))  Comprehensive metabolic panel     Status: Abnormal   Collection Time: 06/23/23  7:28 AM  Result Value Ref Range   Sodium 137 135 - 145 mmol/L   Potassium 3.0 (L) 3.5 - 5.1 mmol/L   Chloride 108 98 - 111 mmol/L   CO2 20 (L) 22 - 32 mmol/L   Glucose, Bld 142 (H) 70 - 99 mg/dL    Comment: Glucose reference range applies only to samples taken after fasting for at least 8 hours.   BUN 12 6 - 20 mg/dL   Creatinine, Ser 9.52 0.44 - 1.00 mg/dL   Calcium 8.1 (L) 8.9 - 10.3 mg/dL   Total Protein 6.3 (L) 6.5 - 8.1 g/dL   Albumin 3.5 3.5 - 5.0 g/dL   AST 12 (L) 15 - 41 U/L   ALT 10 0 - 44 U/L   Alkaline Phosphatase 39 38 - 126 U/L   Total Bilirubin 0.4 0.3 - 1.2 mg/dL   GFR, Estimated >84 >13 mL/min    Comment: (NOTE) Calculated using the CKD-EPI Creatinine Equation (2021)    Anion gap 9 5 - 15    Comment: Performed at [facility], [address] [place], [place] [address]  CBC with Differential     Status: Abnormal   Collection Time: 06/23/23  7:28 AM  Result Value Ref Range   WBC 4.7 4.0 - 10.5 K/uL   RBC 3.38 (L) 3.87 - 5.11 MIL/uL   Hemoglobin 10.2 (L) 12.0 - 15.0 g/dL   HCT 02.7 (L) 25.3 - 66.4 %   MCV 91.7 80.0 - 100.0 fL   MCH 30.2 26.0 - 34.0 pg   MCHC 32.9 30.0 - 36.0 g/dL   RDW 40.3 47.4 - 25.9 %   Platelets 230 150  - 400 K/uL   nRBC 0.0 0.0 - 0.2 %   Neutrophils Relative % 71 %   Neutro Abs 3.3 1.7 - 7.7 K/uL   Lymphocytes Relative 19 %   Lymphs Abs 0.9 0.7 - 4.0 K/uL   Monocytes Relative 9 %   Monocytes Absolute 0.4 0.1 - 1.0 K/uL   Eosinophils Relative 1 %   Eosinophils Absolute 0.0 0.0 - 0.5 K/uL   Basophils Relative 0 %   Basophils Absolute 0.0 0.0 - 0.1 K/uL   Immature Granulocytes 0 %   Abs Immature Granulocytes 0.01 0.00 - 0.07 K/uL    Comment: Performed at [facility], [address] [place], [place] [address]  Lipase, blood     Status: None   Collection Time: 06/23/23  7:28 AM  Result Value Ref Range   Lipase 31 11 - 51 U/L    Comment: Performed at [facility], [address] [place], [place] [address]  Sample to Blood Bank     Status: None   Collection Time: 06/23/23  7:33 AM  Result Value Ref Range   Blood Bank Specimen SAMPLE AVAILABLE FOR  TESTING    Sample Expiration      06/26/2023,2359 Performed at [facility], [address] [place], [place] [address]   hCG, quantitative, pregnancy     Status: Abnormal   Collection Time: 06/23/23  7:33 AM  Result Value Ref Range   hCG, Beta Chain, Quant, S 37,224 (H) <5 mIU/mL    Comment:          GEST. AGE      CONC.  (mIU/mL)   <=1 WEEK        5 - 50     2 WEEKS       50 - 500     3 WEEKS       100 - 10,000     4 WEEKS     1,000 - 30,000     5 WEEKS     3,500 - 115,000   6-8 WEEKS     12,000 - 270,000    12 WEEKS     15,000 - 220,000        FEMALE AND NON-PREGNANT FEMALE:     LESS THAN 5 mIU/mL Performed at [facility], [address] [place], [place] [address]   Type and screen [facility]     Status: None   Collection Time: 06/23/23  7:33 AM  Result Value Ref Range   ABO/RH(D) O POS    Antibody Screen NEG    Sample Expiration 06/26/2023,2359    Unit Number [id number]    Blood Component Type RED CELLS,LR    Unit division 00    Status of Unit  ISSUED,FINAL    Transfusion Status OK TO TRANSFUSE    Crossmatch Result Compatible    Unit Number [id number]    Blood Component Type RED CELLS,LR    Unit division 00    Status of Unit ISSUED,FINAL    Transfusion Status OK TO TRANSFUSE    Crossmatch Result      Compatible Performed at [facility], [address] [place], [place] [address]   Prepare RBC (crossmatch)     Status: None   Collection Time: 06/23/23 12:27 PM  Result Value Ref Range   Order Confirmation      ORDER PROCESSED BY BLOOD BANK Performed at [facility], [address] [place], [place] [address]   Prepare RBC (crossmatch)     Status: None   Collection Time: 06/23/23 12:47 PM  Result Value Ref Range   Order Confirmation      ORDER PROCESSED BY BLOOD BANK Performed at [facility], [address] [place], [place] [address]   CBC     Status: Abnormal   Collection Time: 06/23/23  2:43 PM  Result Value Ref Range   WBC 7.7 4.0 - 10.5 K/uL   RBC 3.61 (L) 3.87 - 5.11 MIL/uL   Hemoglobin 11.1 (L) 12.0 - 15.0 g/dL   HCT 10.9 (L) 32.3 - 55.7 %   MCV 90.3 80.0 - 100.0 fL   MCH 30.7 26.0 - 34.0 pg   MCHC 34.0 30.0 - 36.0 g/dL   RDW 32.2 02.5 - 42.7 %   Platelets 156 150 - 400 K/uL   nRBC 0.0 0.0 - 0.2 %    Comment: Performed at [facility], [address] [place], [place] [address]     Treatments: surgery: as  above  Discharge Exam: Blood pressure 128/88, pulse 88, temperature 98.4 F (36.9 C), resp. rate 16, height 5\' 4"  (1.626 m),  weight 127 kg, SpO2 100%. General appearance: alert and cooperative GI: soft, non-tender; bowel sounds normal; no masses,  no organomegaly and right port site saturated dressing ( will be changed )   Disposition: Discharge disposition: 01-Home or Self Care       Discharge Instructions     Call MD for:   Complete by: As directed    Call MD for:  difficulty breathing, headache or visual disturbances   Complete by: As  directed    Call MD for:  extreme fatigue   Complete by: As directed    Call MD for:  hives   Complete by: As directed    Call MD for:  persistant dizziness or light-headedness   Complete by: As directed    Call MD for:  persistant nausea and vomiting   Complete by: As directed    Call MD for:  redness, tenderness, or signs of infection (pain, swelling, redness, odor or green/yellow discharge around incision site)   Complete by: As directed    Call MD for:  severe uncontrolled pain   Complete by: As directed    Call MD for:  temperature >100.4   Complete by: As directed    Diet - low sodium heart healthy   Complete by: As directed    Increase activity slowly   Complete by: As directed       Allergies as of 06/24/2023       Reactions   Amoxicillin Itching, Rash        Medication List     STOP taking these medications    acetaminophen 500 MG tablet Commonly known as: TYLENOL   lacosamide 200 MG Tabs tablet Commonly known as: VIMPAT   levETIRAcetam 750 MG tablet Commonly known as: KEPPRA   zonisamide 100 MG capsule Commonly known as: ZONEGRAN       TAKE these medications    gabapentin 300 MG capsule Commonly known as: Neurontin Take 1 capsule (300 mg total) by mouth at bedtime for 10 days.   ibuprofen 800 MG tablet Commonly known as: ADVIL Take 1 tablet (800 mg total) by mouth every 8 (eight) hours as needed. What changed:  medication strength how much to take when to take this   ondansetron 4 MG tablet Commonly known as: Zofran Take 1 tablet (4 mg total) by mouth daily as needed for nausea or vomiting.   oxyCODONE-acetaminophen 5-325 MG tablet Commonly known as: Percocet Take 1 tablet by mouth every 6 (six) hours as needed for severe pain or moderate pain.        Follow-up Information     Sanjiv Castorena, Ihor Austin, MD Follow up in 2 week(s).   Specialty: Obstetrics and Gynecology Contact information: [address] [place] [address] [phone]                 Signed: Ihor Austin Renada Cronin 06/24/2023, 10:50 AM

## 2023-06-24 NOTE — Discharge Instructions (Signed)
Call the office to schedule an appointment for follow up with Dr Feliberto Gottron.  If you have questions or concerns after discharge home you should call the office or on call provider after hours.

## 2023-06-25 NOTE — Group Note (Deleted)

## 2023-06-27 LAB — SURGICAL PATHOLOGY

## 2023-11-07 ENCOUNTER — Ambulatory Visit: Payer: Managed Care, Other (non HMO) | Admitting: Dietician

## 2024-01-09 ENCOUNTER — Encounter: Payer: Self-pay | Admitting: Dietician

## 2024-01-09 ENCOUNTER — Encounter: Payer: Managed Care, Other (non HMO) | Attending: Obstetrics and Gynecology | Admitting: Dietician

## 2024-01-09 VITALS — Ht 65.0 in | Wt 287.4 lb

## 2024-01-09 DIAGNOSIS — Z6841 Body Mass Index (BMI) 40.0 and over, adult: Secondary | ICD-10-CM | POA: Insufficient documentation

## 2024-01-09 DIAGNOSIS — Z713 Dietary counseling and surveillance: Secondary | ICD-10-CM | POA: Diagnosis not present

## 2024-01-09 NOTE — Progress Notes (Signed)
 Medical Nutrition Therapy: Visit start time: 1330  end time: 1430  Assessment:   Referral Diagnosis: obesity Other medical history/ diagnoses: none significant Psychosocial issues/ stress concerns: none  Medications, supplements: reconciled list in medical record    Current weight: 287.4lbs Height: 5'5" BMI: 47.83 Patient's personal weight goal: < 200lbs  Progress and evaluation:  Patient reports some weight loss since 09/2023 when weight was >290lbs; has been working to increase protein and decrease sweets and starches, stopped sodas and sweet tea; switched to mostly water and sugar free juice.  Reports fewer restaurant meals  Pre-preps lunches; cooks at home for herself and son. No previous special diets or diet programs She feels she could be under-eating in terms of calories and protein.  Food allergies: none Patient seeks help with weight loss to improve energy and health risk    Dietary Intake:  Usual eating pattern includes 3 meals and 1 snacks per day more snacks on weekends. Dining out frequency: 1-2 meals per week. Who plans meals/ buys groceries? self Who prepares meals? self  Breakfast: 2 boiled eggs, protein shake; sometimes [place] yogurt with fruit, granola Snack: none Lunch: 3/25 spaghetti squash with mushrooms and alfredo sauce, fruit Snack: cucumbers and dip, banana Supper: 3/25 grilled cheese sandwich with chips and dip (worked late) Snack: none Beverages: water, some with crystal light, flavored sparkling water, sugar free juice drink, occasional small coffee at work; occasionally regular fruit juice  Physical activity: no regular exercise   Intervention:   Nutrition Care Education:   Basic nutrition: basic food groups; appropriate nutrient balance; appropriate meal and snack schedule; general nutrition guidelines    Weight control: importance of low sugar and low fat choices; appropriate portion sizes; estimated energy needs for weight loss at 1200-1400  kcal, provided guidance for 40% CHO, 30% pro, 30% fat; honoring satiety cues; eating at regular intervals, allowing for some fluctuation in total caloric, protein intake (averaging close to goal); role of physical activity and importance of adequate daily nutrition in supporting healthy metabolism; finding enjoyable activities and starting with short duration with gradual increase to help with motivation and energy.  Other intervention notes: Patient has been working on positive dietary changes, and she is motivated to continue. Established additional goals with direction from patient. No MNT follow up scheduled at this time, patient will be following up with PCP for weight loss support.    Nutritional Diagnosis:  [place]-3.3 Overweight/obesity As related to history of excess calories and inadequate physical activity.  As evidenced by patient with current BMI of 47.83.   Education Materials given:  Designer, industrial/product with food lists, sample meal pattern Visit summary with goals/ instructions to be viewed via patient portal   Learner/ who was taught:  Patient   Level of understanding: Verbalizes/ demonstrates competency  Demonstrated degree of understanding via:   Teach back Learning barriers: None  Willingness to learn/ readiness for change: Eager, change in progress  Monitoring and Evaluation:  Dietary intake, exercise, and body weight      follow up: prn

## 2024-01-09 NOTE — Patient Instructions (Signed)
 Great job working on [employer], keep it up! Continue to include at least a small portion of protein regularly throughout the day, every 3-5 hours ideally. Start some light exercise, begin with a short duration, such as 15 minutes, and gradually increase as energy increases. Aim for 150 minutes weekly, work up to that goal.
